# Patient Record
Sex: Male | Born: 1965 | Race: White | Hispanic: No | Marital: Married | Smoking: Former smoker
Health system: Southern US, Community
[De-identification: ages and names within clinical notes are randomized; demographics above are authoritative.]

## PROBLEM LIST (undated history)

## (undated) DIAGNOSIS — K219 Gastro-esophageal reflux disease without esophagitis: Secondary | ICD-10-CM

## (undated) DIAGNOSIS — F419 Anxiety disorder, unspecified: Secondary | ICD-10-CM

## (undated) DIAGNOSIS — I10 Essential (primary) hypertension: Secondary | ICD-10-CM

## (undated) DIAGNOSIS — T7840XA Allergy, unspecified, initial encounter: Secondary | ICD-10-CM

## (undated) DIAGNOSIS — Z860101 Personal history of adenomatous and serrated colon polyps: Secondary | ICD-10-CM

## (undated) DIAGNOSIS — E785 Hyperlipidemia, unspecified: Secondary | ICD-10-CM

## (undated) DIAGNOSIS — Z86010 Personal history of colonic polyps: Secondary | ICD-10-CM

## (undated) HISTORY — DX: Allergy, unspecified, initial encounter: T78.40XA

## (undated) HISTORY — DX: Hyperlipidemia, unspecified: E78.5

## (undated) HISTORY — PX: POLYPECTOMY: SHX149

## (undated) HISTORY — DX: Essential (primary) hypertension: I10

## (undated) HISTORY — PX: GYNECOMASTIA EXCISION: SHX5170

## (undated) HISTORY — PX: COLONOSCOPY: SHX174

## (undated) HISTORY — DX: Personal history of colonic polyps: Z86.010

## (undated) HISTORY — DX: Anxiety disorder, unspecified: F41.9

## (undated) HISTORY — PX: WISDOM TOOTH EXTRACTION: SHX21

## (undated) HISTORY — DX: Personal history of adenomatous and serrated colon polyps: Z86.0101

## (undated) HISTORY — PX: EYE SURGERY: SHX253

## (undated) HISTORY — DX: Gastro-esophageal reflux disease without esophagitis: K21.9

---

## 2005-06-12 ENCOUNTER — Ambulatory Visit: Payer: Self-pay | Admitting: Internal Medicine

## 2005-07-24 ENCOUNTER — Ambulatory Visit: Payer: Self-pay | Admitting: Internal Medicine

## 2005-09-18 ENCOUNTER — Ambulatory Visit: Payer: Self-pay | Admitting: Internal Medicine

## 2006-08-05 ENCOUNTER — Ambulatory Visit: Payer: Self-pay | Admitting: Internal Medicine

## 2007-03-04 ENCOUNTER — Ambulatory Visit: Payer: Self-pay | Admitting: Internal Medicine

## 2007-04-17 ENCOUNTER — Emergency Department (HOSPITAL_COMMUNITY): Admission: EM | Admit: 2007-04-17 | Discharge: 2007-04-17 | Payer: Self-pay | Admitting: Family Medicine

## 2007-04-19 ENCOUNTER — Ambulatory Visit: Payer: Self-pay | Admitting: Internal Medicine

## 2007-04-19 ENCOUNTER — Encounter: Payer: Self-pay | Admitting: Internal Medicine

## 2007-04-19 DIAGNOSIS — I10 Essential (primary) hypertension: Secondary | ICD-10-CM | POA: Insufficient documentation

## 2007-04-22 ENCOUNTER — Ambulatory Visit: Payer: Self-pay | Admitting: Internal Medicine

## 2007-08-04 ENCOUNTER — Ambulatory Visit: Payer: Self-pay | Admitting: Internal Medicine

## 2007-08-04 DIAGNOSIS — Z8739 Personal history of other diseases of the musculoskeletal system and connective tissue: Secondary | ICD-10-CM

## 2007-08-04 DIAGNOSIS — F411 Generalized anxiety disorder: Secondary | ICD-10-CM

## 2007-09-01 ENCOUNTER — Ambulatory Visit: Payer: Self-pay | Admitting: Internal Medicine

## 2008-01-18 ENCOUNTER — Ambulatory Visit: Payer: Self-pay | Admitting: Internal Medicine

## 2008-01-18 DIAGNOSIS — K589 Irritable bowel syndrome without diarrhea: Secondary | ICD-10-CM

## 2008-01-18 DIAGNOSIS — M542 Cervicalgia: Secondary | ICD-10-CM

## 2008-01-18 DIAGNOSIS — N529 Male erectile dysfunction, unspecified: Secondary | ICD-10-CM | POA: Insufficient documentation

## 2009-03-11 ENCOUNTER — Encounter: Payer: Self-pay | Admitting: Internal Medicine

## 2009-03-11 ENCOUNTER — Telehealth: Payer: Self-pay | Admitting: Internal Medicine

## 2009-08-13 ENCOUNTER — Encounter: Payer: Self-pay | Admitting: Internal Medicine

## 2010-01-01 ENCOUNTER — Encounter: Payer: Self-pay | Admitting: Internal Medicine

## 2010-03-12 ENCOUNTER — Ambulatory Visit: Payer: Self-pay | Admitting: Internal Medicine

## 2010-04-08 ENCOUNTER — Ambulatory Visit: Payer: Self-pay | Admitting: Psychology

## 2010-04-18 ENCOUNTER — Ambulatory Visit: Payer: Self-pay | Admitting: Psychology

## 2010-04-25 ENCOUNTER — Ambulatory Visit: Payer: Self-pay | Admitting: Psychology

## 2010-05-02 ENCOUNTER — Ambulatory Visit: Payer: Self-pay | Admitting: Psychology

## 2010-05-05 ENCOUNTER — Telehealth: Payer: Self-pay | Admitting: Family Medicine

## 2010-05-30 ENCOUNTER — Ambulatory Visit: Payer: Self-pay | Admitting: Psychology

## 2010-06-02 ENCOUNTER — Ambulatory Visit: Payer: Self-pay | Admitting: Psychology

## 2010-06-02 ENCOUNTER — Ambulatory Visit: Payer: Self-pay | Admitting: Family Medicine

## 2010-06-09 ENCOUNTER — Telehealth: Payer: Self-pay | Admitting: Family Medicine

## 2010-07-09 ENCOUNTER — Ambulatory Visit: Payer: Self-pay | Admitting: Family Medicine

## 2010-07-16 ENCOUNTER — Ambulatory Visit: Payer: Self-pay | Admitting: Family Medicine

## 2010-07-16 DIAGNOSIS — R5381 Other malaise: Secondary | ICD-10-CM

## 2010-07-16 DIAGNOSIS — R5383 Other fatigue: Secondary | ICD-10-CM

## 2010-07-22 LAB — CONVERTED CEMR LAB
ALT: 27 units/L (ref 0–53)
AST: 22 units/L (ref 0–37)
Alkaline Phosphatase: 72 units/L (ref 39–117)
Basophils Absolute: 0 10*3/uL (ref 0.0–0.1)
Bilirubin, Direct: 0.2 mg/dL (ref 0.0–0.3)
CO2: 27 meq/L (ref 19–32)
Chloride: 102 meq/L (ref 96–112)
Cholesterol: 201 mg/dL — ABNORMAL HIGH (ref 0–200)
Eosinophils Absolute: 0.3 10*3/uL (ref 0.0–0.7)
Lymphocytes Relative: 33.1 % (ref 12.0–46.0)
MCHC: 34.8 g/dL (ref 30.0–36.0)
MCV: 83.9 fL (ref 78.0–100.0)
Monocytes Absolute: 0.6 10*3/uL (ref 0.1–1.0)
Neutrophils Relative %: 53.7 % (ref 43.0–77.0)
PSA: 0.99 ng/mL (ref 0.10–4.00)
Potassium: 4.2 meq/L (ref 3.5–5.1)
RDW: 14.1 % (ref 11.5–14.6)
Sodium: 139 meq/L (ref 135–145)
Total CHOL/HDL Ratio: 7
Total Protein: 7 g/dL (ref 6.0–8.3)

## 2010-12-16 NOTE — Miscellaneous (Signed)
Summary: Flu Vaccination/[facility]  Flu Vaccination/[facility]   Imported By: Maryln Gottron 02/07/2010 12:51:20  _____________________________________________________________________  External Attachment:    Type:   Image     Comment:   External Document

## 2010-12-16 NOTE — Progress Notes (Signed)
Summary: refill request for mirtazepine  Phone Note Refill Request Message from:  Fax from Pharmacy  Refills Requested: Medication #1:  MIRTAZAPINE 15 MG TABS 1 by mouth at bedtime. [facility]  Initial call taken by: Lowella Petties CMA,  June 09, 2010 8:47 AM  Follow-up for Phone Call        Patient given #30 last week. Why is there a refill now, can you check?  This medication is essentially unabusable, but does not make sense. Follow-up by: Hannah Beat MD,  June 09, 2010 8:58 AM  Additional Follow-up for Phone Call Additional follow up Details #1::        Pt got the 30 mirtazapine filled at [facility] [address]  He is changing to a mail order pharmacy.  Lowella Petties CMA  June 09, 2010 11:47 AM     Additional Follow-up for Phone Call Additional follow up Details #2::    90 ok put on my desk Follow-up by: Hannah Beat MD,  June 09, 2010 12:16 PM

## 2010-12-16 NOTE — Assessment & Plan Note (Signed)
Summary: med check/refill/bp check/pt fasting/cjr   Vital Signs:  Patient profile:   45 year old male Weight:      216 pounds Temp:     98.3 degrees F oral BP sitting:   110 / 70  (right arm) Cuff size:   regular  Vitals Entered By: Duard Brady LPN (March 12, 2010 8:00 AM) CC: medication review - refills Is Patient Diabetic? No   CC:  medication review - refills.  History of Present Illness: 45 year old patient who is in today for follow-up.  He is not been seen in some time, but has been followed by psychiatry for anxiety and anger issues.  It was suggested that he follow-up with a counselor.  He has moved to [place] and wishes to reestablish with the [employer].  His blood pressure today well controlled, but does not do much in the way of home monitoring.  Seroquel has been added to his regimen, which he states has been quite helpful with sleep  Preventive Screening-Counseling & Management  Alcohol-Tobacco     Smoking Status: quit  Allergies (verified): No Known Drug Allergies  Past History:  Past Medical History: Hypertension Anxiety  Social History: Smoking Status:  quit  Review of Systems  The patient denies anorexia, fever, weight loss, weight gain, vision loss, decreased hearing, hoarseness, chest pain, syncope, dyspnea on exertion, peripheral edema, prolonged cough, headaches, hemoptysis, abdominal pain, melena, hematochezia, severe indigestion/heartburn, hematuria, incontinence, genital sores, muscle weakness, suspicious skin lesions, transient blindness, difficulty walking, depression, unusual weight change, abnormal bleeding, enlarged lymph nodes, angioedema, breast masses, and testicular masses.    Physical Exam  General:  Well-developed,well-nourished,in no acute distress; alert,appropriate and cooperative throughout examination; the pressure 110/70 Head:  Normocephalic and atraumatic without obvious abnormalities. No apparent alopecia or  balding. Eyes:  No corneal or conjunctival inflammation noted. EOMI. Perrla. Funduscopic exam benign, without hemorrhages, exudates or papilledema. Vision grossly normal. Mouth:  Oral mucosa and oropharynx without lesions or exudates.  Teeth in good repair. Neck:  No deformities, masses, or tenderness noted. Lungs:  Normal respiratory effort, chest expands symmetrically. Lungs are clear to auscultation, no crackles or wheezes. Heart:  Normal rate and regular rhythm. S1 and S2 normal without gallop, murmur, click, rub or other extra sounds. Abdomen:  Bowel sounds positive,abdomen soft and non-tender without masses, organomegaly or hernias noted.   Impression & Recommendations:  Problem # 1:  ANXIETY STATE NOS (ICD-300.00)  The following medications were removed from the medication list:    Sertraline Hcl 50 Mg Tabs (Sertraline hcl) ..... One daily His updated medication list for this problem includes:    Lorazepam 0.5 Mg Tabs (Lorazepam) .Marland Kitchen... 1 two times a day prn    Sertraline Hcl 100 Mg Tabs (Sertraline hcl) ..... One daily  Orders: Psychology Referral (Psychology)  Problem # 2:  HYPERTENSION (ICD-401.9)  His updated medication list for this problem includes:    Benazepril Hcl 40 Mg Tabs (Benazepril hcl) ..... One daily    Hydrochlorothiazide 25 Mg Tabs (Hydrochlorothiazide) .Marland Kitchen... Take one (1) by mouth daily  Complete Medication List: 1)  Benazepril Hcl 40 Mg Tabs (Benazepril hcl) .... One daily 2)  Hydrochlorothiazide 25 Mg Tabs (Hydrochlorothiazide) .... Take one (1) by mouth daily 3)  Naproxen 500 Mg Tabs (Naproxen) .... One twice daily 4)  Viagra 50 Mg Tabs (Sildenafil citrate) 5)  Lorazepam 0.5 Mg Tabs (Lorazepam) .Marland Kitchen.. 1 two times a day prn 6)  Seroquel 25 Mg Tabs (Quetiapine fumarate) .... One at bedtime  7)  Sertraline Hcl 100 Mg Tabs (Sertraline hcl) .... One daily  Patient Instructions: 1)  Please schedule a follow-up appointment in 3 months. 2)  Limit your Sodium  (Salt) to less than 2 grams a day(slightly less than 1/2 a teaspoon) to prevent fluid retention, swelling, or worsening of symptoms. 3)  Check your Blood Pressure regularly. If it is above: 150/90  you should make an appointment. Prescriptions: SERTRALINE HCL 100 MG TABS (SERTRALINE HCL) one daily  #90 x 0   Entered and Authorized by:   Gordy Savers  MD   Signed by:   Gordy Savers  MD on 03/12/2010   Method used:   Print then Give to Patient   RxID:   [id number] SEROQUEL 25 MG TABS (QUETIAPINE FUMARATE) one at bedtime  #90 x 0   Entered and Authorized by:   Gordy Savers  MD   Signed by:   Gordy Savers  MD on 03/12/2010   Method used:   Print then Give to Patient   RxID:   [id number] VIAGRA 50 MG  TABS (SILDENAFIL CITRATE)   #90 x 2   Entered and Authorized by:   Gordy Savers  MD   Signed by:   Gordy Savers  MD on 03/12/2010   Method used:   Print then Give to Patient   RxID:   [id number] HYDROCHLOROTHIAZIDE 25 MG TABS (HYDROCHLOROTHIAZIDE) Take one (1) by mouth daily  #90 x 2   Entered and Authorized by:   Gordy Savers  MD   Signed by:   Gordy Savers  MD on 03/12/2010   Method used:   Print then Give to Patient   RxID:   [id number] BENAZEPRIL HCL 40 MG  TABS (BENAZEPRIL HCL) one daily  #90 x 2   Entered and Authorized by:   Gordy Savers  MD   Signed by:   Gordy Savers  MD on 03/12/2010   Method used:   Print then Give to Patient   RxID:   [id number]

## 2010-12-16 NOTE — Progress Notes (Signed)
Summary: new rx needed  Phone Note Refill Request Message from:  Patient---live call  Refills Requested: Medication #1:  VIAGRA 50 MG  TABS pt is out of town. [id number] in [place] phone--[phone].   Initial call taken by: Warnell Forester,  May 05, 2010 3:51 PM  Follow-up for Phone Call        #6  RF 4 Follow-up by: Gordy Savers  MD,  May 05, 2010 5:02 PM  Additional Follow-up for Phone Call Additional follow up Details #1::        pharmacist at lunch will call back Additional Follow-up by: Kern Reap CMA Duncan Dull),  May 06, 2010 1:35 PM    Additional Follow-up for Phone Call Additional follow up Details #2::    rx called in and patient is aware Follow-up by: Kern Reap CMA Duncan Dull),  May 06, 2010 2:54 PM

## 2010-12-16 NOTE — Assessment & Plan Note (Signed)
Summary: 6 WK F/U DLO   Vital Signs:  Patient profile:   45 year old male Height:      71 inches Weight:      217.6 pounds BMI:     30.46 Temp:     98.9 degrees F oral Pulse rate:   72 / minute Pulse rhythm:   regular BP sitting:   118 / 80  (left arm) Cuff size:   large  Vitals Entered By: Benny Lennert CMA Duncan Dull) (July 09, 2010 8:08 AM)  History of Present Illness: Chief complaint 6 week follow up  Anxiety:  BP: stable    Allergies (verified): No Known Drug Allergies  Past History:  Past medical, surgical, family and social histories (including risk factors) reviewed, and no changes noted (except as noted below).  Past Medical History: Reviewed history from 03/12/2010 and no changes required. Hypertension Anxiety  Past Surgical History: Reviewed history from 04/19/2007 and no changes required. Denies surgical history  Family History: Reviewed history from 04/19/2007 and no changes required. Family History Hypertension Family History of Melanoma Family History of Cardiovascular disorder  Social History: Reviewed history from 06/02/2010 and no changes required. Occupation: Doctor, hospital, [employer], of 15 years Current Smoker Alcohol use-no Drug use-no   Impression & Recommendations:  Problem # 1:  ANXIETY (ICD-300.00) >15 minutes spent in face to face time with patient, >50% spent in counselling or coordination of care: doing pretty well, anxiety well controlled. Having some delayed ejac issues. Things going OK at home. ? decreased sensations in privates with increased medications, but feeling more stable.   His updated medication list for this problem includes:    Lorazepam 0.5 Mg Tabs (Lorazepam) .Marland Kitchen... 1 two times a day as needed    Sertraline Hcl 100 Mg Tabs (Sertraline hcl) .Marland Kitchen... 1 1/2 tabs by mouth daily    Mirtazapine 15 Mg Tabs (Mirtazapine) .Marland Kitchen... 1 by mouth at bedtime  Complete Medication List: 1)  Benazepril Hcl 40 Mg  Tabs (Benazepril hcl) .... One daily 2)  Hydrochlorothiazide 25 Mg Tabs (Hydrochlorothiazide) .... Take one (1) by mouth daily 3)  Naproxen 500 Mg Tabs (Naproxen) .... One twice daily 4)  Viagra 50 Mg Tabs (Sildenafil citrate) 5)  Lorazepam 0.5 Mg Tabs (Lorazepam) .Marland Kitchen.. 1 two times a day as needed 6)  Sertraline Hcl 100 Mg Tabs (Sertraline hcl) .Marland Kitchen.. 1 1/2 tabs by mouth daily 7)  Mirtazapine 15 Mg Tabs (Mirtazapine) .Marland Kitchen.. 1 by mouth at bedtime  Patient Instructions: 1)  Prephysical Labs, several days before, fasting 2)  BMP, HFP, FLP, CBC with diff, TSH, PSA: v77.91, v77.1, ,780.79, v76.44   Current Allergies (reviewed today): No known allergies

## 2010-12-16 NOTE — Assessment & Plan Note (Signed)
Summary: NEW PT TO EST/CLE   Vital Signs:  Patient profile:   45 year old male Height:      71 inches Weight:      215.50 pounds BMI:     30.16 Temp:     98.7 degrees F oral Pulse rate:   72 / minute Pulse rhythm:   regular BP sitting:   100 / 68  (left arm) Cuff size:   large  Vitals Entered By: Sydell Axon LPN (June 02, 2010 9:31 AM) CC: New patient to get established   History of Present Illness: 45 year old male:  HTN: at goal and on medications blood  Anxiety: on Zoloft. Did see a psychiatrist. Right now he is also on some antipsychotics. The suicidal ideation or homicidal ideation. multiple medications in the past. Benzodiazepine use is as very minimal.  Seeing Dr. Dellia Cloud, sertraline.   saw a psychiatrist. Then went to a therapist.   Allergies: No Known Drug Allergies  Past History:  Past medical, surgical, family and social histories (including risk factors) reviewed, and no changes noted (except as noted below).  Past Medical History: Reviewed history from 03/12/2010 and no changes required. Hypertension Anxiety  Past Surgical History: Reviewed history from 04/19/2007 and no changes required. Denies surgical history  Family History: Reviewed history from 04/19/2007 and no changes required. Family History Hypertension Family History of Melanoma Family History of Cardiovascular disorder  Social History: Reviewed history from 04/19/2007 and no changes required. Occupation: [employer] Current Smoker Alcohol use-no Drug use-no  Review of Systems       ROS: GEN: No acute illnesses, no fevers, chills, sweats, fatigue, weight loss, or URI sx. GI: No n/v/d Pulm: No SOB, cough, wheezing Interactive and getting along well at home.  Otherwise, ROS is as per the HPI.   Physical Exam  General:  Well-developed,well-nourished,in no acute distress; alert,appropriate and cooperative throughout examination Head:  Normocephalic and  atraumatic without obvious abnormalities. No apparent alopecia or balding. Ears:  External ear exam shows no significant lesions or deformities.  Otoscopic examination reveals clear canals, tympanic membranes are intact bilaterally without bulging, retraction, inflammation or discharge. Hearing is grossly normal bilaterally. Nose:  no external deformity.   Mouth:  Oral mucosa and oropharynx without lesions or exudates.  Teeth in good repair. Neck:  No deformities, masses, or tenderness noted. Lungs:  Normal respiratory effort, chest expands symmetrically. Lungs are clear to auscultation, no crackles or wheezes. Heart:  Normal rate and regular rhythm. S1 and S2 normal without gallop, murmur, click, rub or other extra sounds. Cervical Nodes:  No lymphadenopathy noted Psych:  Cognition and judgment appear intact. Alert and cooperative with normal attention span and concentration. No apparent delusions, illusions, hallucinations   Impression & Recommendations:  Problem # 1:  HYPERTENSION (ICD-401.9)  His updated medication list for this problem includes:    Benazepril Hcl 40 Mg Tabs (Benazepril hcl) ..... One daily    Hydrochlorothiazide 25 Mg Tabs (Hydrochlorothiazide) .Marland Kitchen... Take one (1) by mouth daily  Problem # 2:  ANXIETY (ICD-300.00) increase medications  The following medications were removed from the medication list:    Sertraline Hcl 100 Mg Tabs (Sertraline hcl) ..... One daily His updated medication list for this problem includes:    Lorazepam 0.5 Mg Tabs (Lorazepam) .Marland Kitchen... 1 two times a day as needed    Sertraline Hcl 100 Mg Tabs (Sertraline hcl) .Marland Kitchen... 1 1/2 tabs by mouth daily    Mirtazapine 15 Mg Tabs (Mirtazapine) .Marland KitchenMarland KitchenMarland KitchenMarland Kitchen  1 by mouth at bedtime  Complete Medication List: 1)  Benazepril Hcl 40 Mg Tabs (Benazepril hcl) .... One daily 2)  Hydrochlorothiazide 25 Mg Tabs (Hydrochlorothiazide) .... Take one (1) by mouth daily 3)  Naproxen 500 Mg Tabs (Naproxen) .... One twice daily 4)   Viagra 50 Mg Tabs (Sildenafil citrate) 5)  Lorazepam 0.5 Mg Tabs (Lorazepam) .Marland Kitchen.. 1 two times a day as needed 6)  Sertraline Hcl 100 Mg Tabs (Sertraline hcl) .Marland Kitchen.. 1 1/2 tabs by mouth daily 7)  Mirtazapine 15 Mg Tabs (Mirtazapine) .Marland Kitchen.. 1 by mouth at bedtime Prescriptions: MIRTAZAPINE 15 MG TABS (MIRTAZAPINE) 1 by mouth at bedtime  #30 x 3   Entered and Authorized by:   Hannah Beat MD   Signed by:   Hannah Beat MD on 06/02/2010   Method used:   Print then Give to Patient   RxID:   [id number] SERTRALINE HCL 100 MG TABS (SERTRALINE HCL) 1 1/2 tabs by mouth daily  #45 x 3   Entered and Authorized by:   Hannah Beat MD   Signed by:   Hannah Beat MD on 06/02/2010   Method used:   Print then Give to Patient   RxID:   [id number]   Current Allergies (reviewed today): No known allergies

## 2011-04-30 ENCOUNTER — Encounter: Payer: Self-pay | Admitting: Family Medicine

## 2011-04-30 ENCOUNTER — Ambulatory Visit (INDEPENDENT_AMBULATORY_CARE_PROVIDER_SITE_OTHER): Payer: BC Managed Care – PPO | Admitting: Family Medicine

## 2011-04-30 VITALS — BP 120/80 | HR 87 | Temp 99.5°F | Wt 217.5 lb

## 2011-04-30 DIAGNOSIS — J069 Acute upper respiratory infection, unspecified: Secondary | ICD-10-CM

## 2011-04-30 MED ORDER — MIRTAZAPINE 15 MG PO TABS: 15.0000 mg | ORAL_TABLET | Freq: Every day | ORAL | Status: DC

## 2011-04-30 MED ORDER — BENAZEPRIL HCL 40 MG PO TABS: 40.0000 mg | ORAL_TABLET | Freq: Every day | ORAL | Status: DC

## 2011-04-30 MED ORDER — HYDROCHLOROTHIAZIDE 25 MG PO TABS: 25.0000 mg | ORAL_TABLET | Freq: Every day | ORAL | Status: DC

## 2011-04-30 MED ORDER — SILDENAFIL CITRATE 50 MG PO TABS: 50.0000 mg | ORAL_TABLET | Freq: Every day | ORAL | Status: DC | PRN

## 2011-04-30 MED ORDER — AMOXICILLIN 500 MG PO CAPS: 500.0000 mg | ORAL_CAPSULE | Freq: Two times a day (BID) | ORAL | Status: AC

## 2011-04-30 MED ORDER — SERTRALINE HCL 100 MG PO TABS: 100.0000 mg | ORAL_TABLET | Freq: Every day | ORAL | Status: DC

## 2011-04-30 MED ORDER — SERTRALINE HCL 100 MG PO TABS: ORAL_TABLET | ORAL | Status: DC

## 2011-04-30 NOTE — Progress Notes (Signed)
Addended by: Jobie Quaker on: 04/30/2011 04:13 PM   Modules accepted: Orders

## 2011-04-30 NOTE — Progress Notes (Signed)
SUBJECTIVE:  Paul Vazquez is a 45 y.o. male who complains of coryza, congestion, sneezing, sore throat, night sweats, productive cough, headache and fever for 7 days. He denies a history of chest pain, nausea and shortness of breath and denies a history of asthma. Patient does smoke cigarettes.   OBJECTIVE: BP 120/80  Pulse 87  Temp(Src) 99.5 F (37.5 C) (Oral)  Wt 217 lb 8 oz (98.657 kg)  He appears well, vital signs are as noted. Ears normal.  Throat and pharynx normal.  Neck supple. No adenopathy in the neck. Nose is congested. Sinuses non tender. The chest is clear, without wheezes or rales.  ASSESSMENT:  viral upper respiratory illness and sinusitis  PLAN: Symptomatic therapy suggested: push fluids, rest and return office visit prn if symptoms persist or worsen. Lack of antibiotic effectiveness discussed with him. Call or return to clinic prn if these symptoms worsen or fail to improve as anticipated. RX for amoxicillin given in case symptoms do not improve as anticipated. The patient indicates understanding of these issues and agrees with the plan.

## 2011-05-06 ENCOUNTER — Other Ambulatory Visit: Payer: Self-pay | Admitting: Internal Medicine

## 2011-05-22 ENCOUNTER — Other Ambulatory Visit: Payer: Self-pay | Admitting: *Deleted

## 2011-05-22 MED ORDER — SERTRALINE HCL 100 MG PO TABS: ORAL_TABLET | ORAL | Status: DC

## 2011-05-22 MED ORDER — HYDROCHLOROTHIAZIDE 25 MG PO TABS: 25.0000 mg | ORAL_TABLET | Freq: Every day | ORAL | Status: DC

## 2011-05-22 MED ORDER — BENAZEPRIL HCL 40 MG PO TABS: 40.0000 mg | ORAL_TABLET | Freq: Every day | ORAL | Status: DC

## 2011-05-22 NOTE — Telephone Encounter (Signed)
Dr. Cyndie Chime patient. I will route to [facility].

## 2011-05-26 ENCOUNTER — Telehealth: Payer: Self-pay | Admitting: *Deleted

## 2011-05-26 NOTE — Telephone Encounter (Signed)
Forms from [facility] are on your desk, please sign these for new scripts.  They are asking for new scripts.  Refills were sent in on 7/6 but since they have faxed forms I dont know if the refills went through.  Also, quantity sent in for sertraline is not correct for a 3 month supply.

## 2011-05-26 NOTE — Telephone Encounter (Signed)
Noted. Will complete paper forms.

## 2011-06-23 ENCOUNTER — Other Ambulatory Visit: Payer: Self-pay | Admitting: Family Medicine

## 2011-06-24 NOTE — Telephone Encounter (Signed)
Dr. Cyndie Chime patient. I will route to him.

## 2011-07-30 ENCOUNTER — Other Ambulatory Visit: Payer: Self-pay | Admitting: *Deleted

## 2011-07-30 ENCOUNTER — Other Ambulatory Visit: Payer: Self-pay | Admitting: Family Medicine

## 2011-07-30 MED ORDER — MIRTAZAPINE 15 MG PO TABS: 15.0000 mg | ORAL_TABLET | Freq: Every day | ORAL | Status: DC

## 2011-08-18 ENCOUNTER — Other Ambulatory Visit: Payer: Self-pay | Admitting: *Deleted

## 2011-08-18 MED ORDER — MIRTAZAPINE 15 MG PO TABS: 15.0000 mg | ORAL_TABLET | Freq: Every day | ORAL | Status: DC

## 2011-08-18 NOTE — Telephone Encounter (Signed)
Last refill 07/30/2011 #30 with 5 refills.  Fax request for 90 day supply.  Please advise.

## 2011-08-18 NOTE — Telephone Encounter (Signed)
Rx sent to [facility]

## 2011-08-18 NOTE — Telephone Encounter (Signed)
Ok to do

## 2012-03-15 ENCOUNTER — Other Ambulatory Visit: Payer: Self-pay | Admitting: Family Medicine

## 2012-03-21 ENCOUNTER — Encounter: Payer: Self-pay | Admitting: Family Medicine

## 2012-03-21 ENCOUNTER — Ambulatory Visit (INDEPENDENT_AMBULATORY_CARE_PROVIDER_SITE_OTHER)
Admission: RE | Admit: 2012-03-21 | Discharge: 2012-03-21 | Disposition: A | Discharge status: Home / Self Care | Payer: BC Managed Care – PPO | Source: Ambulatory Visit | Attending: Family Medicine | Admitting: Family Medicine

## 2012-03-21 ENCOUNTER — Other Ambulatory Visit: Payer: Self-pay | Admitting: *Deleted

## 2012-03-21 ENCOUNTER — Ambulatory Visit (INDEPENDENT_AMBULATORY_CARE_PROVIDER_SITE_OTHER): Payer: BC Managed Care – PPO | Admitting: Family Medicine

## 2012-03-21 VITALS — BP 130/88 | HR 73 | Temp 98.7°F | Ht 71.0 in | Wt 214.1 lb

## 2012-03-21 DIAGNOSIS — M549 Dorsalgia, unspecified: Secondary | ICD-10-CM

## 2012-03-21 MED ORDER — CYCLOBENZAPRINE HCL 10 MG PO TABS: 10.0000 mg | ORAL_TABLET | Freq: Three times a day (TID) | ORAL | Status: AC | PRN

## 2012-03-21 MED ORDER — SILDENAFIL CITRATE 50 MG PO TABS: 50.0000 mg | ORAL_TABLET | Freq: Every day | ORAL | Status: DC | PRN

## 2012-03-21 MED ORDER — PREDNISONE 20 MG PO TABS: 20.0000 mg | ORAL_TABLET | Freq: Every day | ORAL | Status: AC

## 2012-03-21 MED ORDER — TRAMADOL HCL 50 MG PO TABS: 50.0000 mg | ORAL_TABLET | Freq: Four times a day (QID) | ORAL | Status: AC | PRN

## 2012-03-21 NOTE — Telephone Encounter (Signed)
Flexeril, prednisone and viagra refilled today called to [facility].  [facility] in [facility] by Dr. Patsy Lager.  [facility] at [facility].

## 2012-03-21 NOTE — Patient Instructions (Signed)
When prednisone done  Alleve 2 tabs by mouth two times a day over the counter: Take at least for 2 - 3 weeks. This is equal to a prescripton strength dose (GENERIC CHEAPER EQUIVALENT IS NAPROXEN SODIUM)   Call in a few weeks when back gets better

## 2012-03-21 NOTE — Progress Notes (Signed)
Patient Name: Paul Vazquez Date of Birth: November 07, 1966 Medical Record Number: [id number] Gender: male Date of Encounter: 03/21/2012  History of Present Illness:  Paul Vazquez is a 46 y.o. very pleasant male patient who presents with the following:  About a week ago, back really started to feel irritated. Cannot stand up straight.  Ruptured disc in the past. Once recently, but usually a couple of times a year  No numbness or tingling.  20 years lateral leg numbness  Saw Paul Vazquez at [facility] last year, advised working on core musculature.   Back Pain: Patient presents for presents evaluation of low back problems.  Symptoms have been present for several days and include numbness in lateral thigh (not new), pain in lower back diffusely (aching, sharp and stabbing in character; 8/10 in severity) and stiffness in lumbar. Initial inciting event: moving and heard a pop. Symptoms are worst: with moving. Alleviating factors identifiable by patient are bending forwards. Exacerbating factors identifiable by patient are bending backwards, bending forwards and bending sideways. Treatments so far initiated by patient: otc nsaids and tylenol Previous lower back problems: 20+ year. Previous workup: XR, MRI. Previous treatments: none.    Patient Active Problem List  Diagnoses  . Anxiety State, Unspecified  . HYPERTENSION  . IRRITABLE BOWEL SYNDROME  . ERECTILE DYSFUNCTION, ORGANIC  . NECK PAIN  . FATIGUE  . SHOULDER PAIN, RIGHT, HX OF   Past Medical History  Diagnosis Date  . Hypertension   . Anxiety    No past surgical history on file. History  Substance Use Topics  . Smoking status: Current Everyday Smoker  . Smokeless tobacco: Not on file  . Alcohol Use: No   No family history on file. No Known Allergies  Medication list has been reviewed and updated.  Review of Systems:  GEN: No fevers, chills. Nontoxic. Primarily MSK c/o today. MSK: Detailed in the HPI GI: tolerating PO  intake without difficulty Neuro: detailed above Otherwise the pertinent positives of the ROS are noted above.    Physical Examination: Filed Vitals:   03/21/12 1522  BP: 130/88  Pulse: 73  Temp: 98.7 F (37.1 C)  TempSrc: Oral  Height: 5\' 11"  (1.803 m)  Weight: 214 lb 1.9 oz (97.124 kg)  SpO2: 96%    Body mass index is 29.86 kg/(m^2).   GEN: Well-developed,well-nourished,in no acute distress; alert,appropriate and cooperative throughout examination HEENT: Normocephalic and atraumatic without obvious abnormalities. Ears, externally no deformities PULM: Breathing comfortably in no respiratory distress EXT: No clubbing, cyanosis, or edema PSYCH: Normally interactive. Cooperative during the interview. Pleasant. Friendly and conversant. Not anxious or depressed appearing. Normal, full affect.  Range of motion at  the waist: Flexion, extension, lateral bending and rotation: unable to extend, flexion to 30 deg  No echymosis or edema Rises to examination table with mild difficulty Gait: mildly antalgic  Inspection/Deformity: N Paraspinus Tenderness: L4-s1  B Ankle Dorsiflexion (L5,4): 5/5 B Great Toe Dorsiflexion (L5,4): 5/5 Heel Walk (L5): WNL Toe Walk (S1): WNL Rise/Squat (L4): WNL, mild pain  SENSORY B Medial Foot (L4): WNL B Dorsum (L5): WNL B Lateral (S1): WNL Light Touch: lateral thigh decrease sensation Pinprick: WNL  REFLEXES Knee (L4): 2+ Ankle (S1): 2+  B SLR, seated: neg B SLR, supine: neg B Greater Troch: NT B Log Roll: neg B Sciatic Notch: NT   Assessment and Plan:   1. 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 DG Lumbar Spine Complete    Standing Status: Future     Number of Occurrences: 1     Standing Expiration Date: 05/21/2013    Order Specific Question:  Preferred imaging location?    Answer:  [facility]    Order Specific Question:  Reason for exam:    Answer:  back pain, acute    Medications Today: Meds ordered this encounter  Medications  . predniSONE (DELTASONE) 20 MG tablet    Sig: Take 1 tablet (20 mg total) by mouth daily.    Dispense:  7 tablet    Refill:  0  . traMADol (ULTRAM) 50 MG tablet    Sig: Take 1 tablet (50 mg total) by mouth every 6 (six) hours as needed for pain.    Dispense:  50 tablet    Refill:  2  . cyclobenzaprine (FLEXERIL) 10 MG tablet    Sig: Take 1 tablet (10 mg total) by mouth 3 (three) times daily as needed for muscle spasms.    Dispense:  45 tablet    Refill:  2  . sildenafil (VIAGRA) 50 MG tablet    Sig: Take 1 tablet (50 mg total) by mouth daily as needed.    Dispense:  30 tablet    Refill:  3    Dg Lumbar Spine Complete  03/21/2012  *RADIOLOGY REPORT*  Clinical Data: 46 year old male with pain.  LUMBAR SPINE - COMPLETE 4+ VIEW  Comparison: None.  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 Correlation with radiographs is recommended prior to any operative intervention.  L4-L5 moderate to severe disc space loss with endplate spurring. Vertebral height and alignment within normal limits; mild wedging of the lower thoracic levels and L1 appears chronic or congenital. No pars fracture.  SI joints within normal limits.  IMPRESSION: 1.  Transitional anatomy. L5 designated as sacralized for the purposes of this report. 2.  Chronic L4-L5 disc degeneration.  No acute osseous abnormality.  Original Report Authenticated By: Harley Hallmark, M.D.

## 2012-03-22 ENCOUNTER — Other Ambulatory Visit: Payer: Self-pay

## 2012-03-22 NOTE — Telephone Encounter (Signed)
[facility] and [facility] tramadol script.

## 2012-03-22 NOTE — Telephone Encounter (Signed)
Pt said Tramadol was not sent to [facility] when saw Dr Patsy Lager on 03/21/12. Tramadol 50 mg # 50 x 2 called to laurie at [facility]. Pt notified while on phone. Lowella Petties said she would contact [facility] to cancel Tramadol.

## 2012-04-25 ENCOUNTER — Other Ambulatory Visit: Payer: Self-pay | Admitting: Family Medicine

## 2012-04-27 ENCOUNTER — Telehealth: Payer: Self-pay | Admitting: Family Medicine

## 2012-04-27 MED ORDER — LORAZEPAM 0.5 MG PO TABS: 0.5000 mg | ORAL_TABLET | Freq: Three times a day (TID) | ORAL | Status: DC | PRN

## 2012-04-27 NOTE — Telephone Encounter (Signed)
Caller: Braylynn/Patient; PCP: Juleen China.; CB#: [phone];  Call regarding Had To Put Dog Down This Am; Can Something Be Called In?;  Reports grief; trouble coping after loosing dog to bladder cancer.  Dog was 48 yrs old.  Asking for same RX MD sent approx 3 yrs ago when another dog died. Info noted and sent to MD per pt request for RX  for recent loss per Depression Guideline. [facility] [phone].  Please call back to advise if RX called in or must be seen.

## 2012-04-27 NOTE — Telephone Encounter (Signed)
rx called to pharmacy and patient advised 

## 2012-04-27 NOTE — Telephone Encounter (Signed)
i think that is ok, use sparingly  Ativan 0.5 mg, 1 po q 8 hours prn anxiety. #30, 0 refills

## 2012-05-25 ENCOUNTER — Ambulatory Visit (INDEPENDENT_AMBULATORY_CARE_PROVIDER_SITE_OTHER): Payer: BC Managed Care – PPO | Admitting: Family Medicine

## 2012-05-25 ENCOUNTER — Encounter: Payer: Self-pay | Admitting: Family Medicine

## 2012-05-25 VITALS — BP 116/78 | HR 83 | Temp 98.9°F | Ht 71.0 in | Wt 213.1 lb

## 2012-05-25 DIAGNOSIS — R42 Dizziness and giddiness: Secondary | ICD-10-CM

## 2012-05-25 DIAGNOSIS — IMO0001 Reserved for inherently not codable concepts without codable children: Secondary | ICD-10-CM

## 2012-05-25 DIAGNOSIS — R61 Generalized hyperhidrosis: Secondary | ICD-10-CM

## 2012-05-25 NOTE — Progress Notes (Signed)
[facility] [address] [place] [address] Phone: [phone] Fax: [phone]  Date:  05/25/2012   Name:  Paul Vazquez   DOB:  05/02/1966   MRN:  [id number]  PCP:  Hannah Beat, MD    Chief Complaint: No chief complaint on file.   History of Present Illness:  Paul Vazquez is a 46 y.o. very pleasant male patient who presents with the following:  Every once and a while will get jittery, sweat in te afternoon and feels a little lightheaded. The other day, went and got a snickers bar and had had some lunch. Aftere eating ten or fifteen minutes, will feel like vision is maybe floating a little bit.  He denies any palpitations, shortness of breath or chest pain. He has not checked his blood pressure with those times. Is not associated with movement or changing positions. Sometimes he does associated with lack of eating.  It is not associated with intake of any other specific food, supplement, or drink. He has no other GI complaints. No recent illnesses.  This will only occur approximately once a month, and last for very short time.  Past Medical History, Surgical History, Social History, Family History, Problem List, Medications, and Allergies have been reviewed and updated if relevant.  Current Outpatient Prescriptions on File Prior to Visit  Medication Sig Dispense Refill  . benazepril (LOTENSIN) 40 MG tablet TAKE 1 TABLET (40 MG TOTAL) BY MOUTH DAILY.  30 tablet  6  . hydrochlorothiazide 25 MG tablet TAKE 1 TABLET BY MOUTH DAILY  90 tablet  2  . mirtazapine (REMERON) 15 MG tablet Take 1 tablet (15 mg total) by mouth at bedtime.  90 tablet  3  . sertraline (ZOLOFT) 100 MG tablet TAKE 1 1/2 TABLETS BY MOUTH EVERY DAY  45 tablet  5  . sildenafil (VIAGRA) 50 MG tablet Take 1 tablet (50 mg total) by mouth daily as needed.  30 tablet  3  . DISCONTD: mirtazapine (REMERON) 15 MG tablet TAKE 1 TABLET AT BEDTIME  30 tablet  4    Review of  Systems: As above. Neither anxious nor depressed. No syncope.  Physical Examination: Filed Vitals:   05/25/12 1538  BP: 120/84  Pulse: 83  Temp: 98.9 F (37.2 C)   Filed Vitals:   05/25/12 1538  Height: 5\' 11"  (1.803 m)  Weight: 213 lb 1.9 oz (96.671 kg)   Body mass index is 29.72 kg/(m^2). Ideal Body Weight: Weight in (lb) to have BMI = 25: 178.9   Filed Vitals:   05/25/12 1538 05/25/12 1655 05/25/12 1657 05/25/12 1658  BP: 120/84 113/79 109/82 116/78  Pulse: 83 71 85 83  Temp: 98.9 F (37.2 C)     TempSrc: Oral     Height: 5\' 11"  (1.803 m)     Weight: 213 lb 1.9 oz (96.671 kg)     SpO2: 95%        GEN: WDWN, NAD, Non-toxic, A & O x 3 HEENT: Atraumatic, Normocephalic. Neck supple. No masses, No LAD. Ears and Nose: No external deformity. CV: RRR, No M/G/R. No JVD. No thrill. No extra heart sounds. PULM: CTA B, no wheezes, crackles, rhonchi. No retractions. No resp. distress. No accessory muscle use. EXTR: No c/c/e NEURO Normal gait.  PSYCH: Normally interactive. Conversant. Not depressed or anxious appearing.  Calm demeanor.    EKG / Xrays / Labs: None available at the time of encounter.  Assessment and Plan: 1. Dizziness  Basic metabolic panel, CBC with Differential, Hepatic function panel, PSA  2. Sweating  Basic metabolic panel, CBC with Differential, Hepatic function panel, PSA    I think most likely occasional hypoglycemia. No orthostasis.  At this point I reassured the patient, and encourage him to eat at least 3 meals a day, and to keep a snack handy to eat between meals.  Hannah Beat, MD

## 2012-05-26 LAB — CBC WITH DIFFERENTIAL/PLATELET
Basophils Absolute: 0 10*3/uL (ref 0.0–0.1)
Basophils Relative: 0.3 % (ref 0.0–3.0)
Eosinophils Absolute: 0.3 10*3/uL (ref 0.0–0.7)
Lymphocytes Relative: 29.2 % (ref 12.0–46.0)
MCHC: 34 g/dL (ref 30.0–36.0)
Neutrophils Relative %: 60.7 % (ref 43.0–77.0)
Platelets: 245 10*3/uL (ref 150.0–400.0)
RBC: 5.65 Mil/uL (ref 4.22–5.81)

## 2012-05-26 LAB — HEPATIC FUNCTION PANEL
Bilirubin, Direct: 0.1 mg/dL (ref 0.0–0.3)
Total Bilirubin: 0.6 mg/dL (ref 0.3–1.2)

## 2012-05-26 LAB — BASIC METABOLIC PANEL
CO2: 27 mEq/L (ref 19–32)
Calcium: 9.3 mg/dL (ref 8.4–10.5)
Creatinine, Ser: 0.9 mg/dL (ref 0.4–1.5)

## 2012-05-27 ENCOUNTER — Encounter: Payer: Self-pay | Admitting: *Deleted

## 2012-08-09 ENCOUNTER — Other Ambulatory Visit: Payer: Self-pay | Admitting: Family Medicine

## 2012-08-09 NOTE — Telephone Encounter (Signed)
Electronic refill request.  Please advise. 

## 2012-12-09 ENCOUNTER — Other Ambulatory Visit: Payer: Self-pay | Admitting: Family Medicine

## 2013-01-23 ENCOUNTER — Other Ambulatory Visit: Payer: Self-pay

## 2013-01-23 NOTE — Telephone Encounter (Signed)
Pt waiting on mail order on mirtazapine. Pt request 3 day [facility].Please advise.

## 2013-01-24 MED ORDER — MIRTAZAPINE 15 MG PO TABS: 15.0000 mg | ORAL_TABLET | Freq: Every day | ORAL | Status: DC

## 2013-02-03 ENCOUNTER — Other Ambulatory Visit: Payer: Self-pay | Admitting: Family Medicine

## 2013-03-15 ENCOUNTER — Other Ambulatory Visit: Payer: Self-pay | Admitting: Family Medicine

## 2013-06-09 ENCOUNTER — Other Ambulatory Visit: Payer: Self-pay | Admitting: Family Medicine

## 2013-06-09 NOTE — Telephone Encounter (Signed)
Okay to wait for PCPs recommendations.

## 2013-06-09 NOTE — Telephone Encounter (Signed)
Pt has not been seen in a year, asking for a 90 day supply of zoloft. Ok to refill?

## 2013-06-11 NOTE — Telephone Encounter (Signed)
Please schedule a 30 minute CPX for the patient and refill 135, 0 refills  Just tell him he needs to have a physical

## 2013-06-12 NOTE — Telephone Encounter (Signed)
Patient will call back for appt.

## 2013-06-12 NOTE — Telephone Encounter (Signed)
Left message ffor patient to call and schedule appt and medication called to pharmacy

## 2013-06-15 ENCOUNTER — Other Ambulatory Visit: Payer: Self-pay | Admitting: Family Medicine

## 2013-06-16 NOTE — Telephone Encounter (Signed)
Left message asking patient to call back

## 2013-06-16 NOTE — Telephone Encounter (Signed)
i am ok to refill it, but remind him he is way overdue for a physical and has not had one in a few years. Help set up set up later in fall.

## 2013-06-16 NOTE — Telephone Encounter (Signed)
Refill request for viagra.  Patient last seen 7/13, has no upcoming appts.

## 2013-06-20 NOTE — Telephone Encounter (Signed)
Left message on cell phone voice mail advising patient that he needs to call to schedule appt for physical.

## 2013-08-25 ENCOUNTER — Telehealth: Payer: Self-pay | Admitting: *Deleted

## 2013-08-25 MED ORDER — HYDROCHLOROTHIAZIDE 25 MG PO TABS: ORAL_TABLET | ORAL | Status: DC

## 2013-08-25 MED ORDER — BENAZEPRIL HCL 40 MG PO TABS: ORAL_TABLET | ORAL | Status: DC

## 2013-08-25 MED ORDER — MIRTAZAPINE 15 MG PO TABS: ORAL_TABLET | ORAL | Status: DC

## 2013-08-25 NOTE — Telephone Encounter (Signed)
Patient called today and scheduled his CPX for 10/09/2013.  Needs refills on BP meds and Remeron to get him to that appointment.

## 2013-10-09 ENCOUNTER — Encounter: Payer: BC Managed Care – PPO | Admitting: Family Medicine

## 2013-11-05 ENCOUNTER — Other Ambulatory Visit: Payer: Self-pay | Admitting: Family Medicine

## 2013-11-14 ENCOUNTER — Other Ambulatory Visit: Payer: Self-pay | Admitting: Family Medicine

## 2013-11-14 DIAGNOSIS — Z125 Encounter for screening for malignant neoplasm of prostate: Secondary | ICD-10-CM

## 2013-11-14 DIAGNOSIS — Z13220 Encounter for screening for lipoid disorders: Secondary | ICD-10-CM

## 2013-11-14 DIAGNOSIS — Z79899 Other long term (current) drug therapy: Secondary | ICD-10-CM

## 2013-11-15 ENCOUNTER — Other Ambulatory Visit (INDEPENDENT_AMBULATORY_CARE_PROVIDER_SITE_OTHER): Payer: BC Managed Care – PPO

## 2013-11-15 DIAGNOSIS — Z79899 Other long term (current) drug therapy: Secondary | ICD-10-CM

## 2013-11-15 DIAGNOSIS — Z125 Encounter for screening for malignant neoplasm of prostate: Secondary | ICD-10-CM

## 2013-11-15 DIAGNOSIS — Z13220 Encounter for screening for lipoid disorders: Secondary | ICD-10-CM

## 2013-11-15 LAB — CBC WITH DIFFERENTIAL/PLATELET
Eosinophils Absolute: 0.4 10*3/uL (ref 0.0–0.7)
Eosinophils Relative: 4.6 % (ref 0.0–5.0)
HCT: 47.7 % (ref 39.0–52.0)
Hemoglobin: 16.2 g/dL (ref 13.0–17.0)
Lymphs Abs: 2.8 10*3/uL (ref 0.7–4.0)
MCHC: 34 g/dL (ref 30.0–36.0)
MCV: 83.5 fl (ref 78.0–100.0)
Monocytes Absolute: 0.6 10*3/uL (ref 0.1–1.0)
Neutro Abs: 5.2 10*3/uL (ref 1.4–7.7)
Neutrophils Relative %: 57.7 % (ref 43.0–77.0)
Platelets: 256 10*3/uL (ref 150.0–400.0)
RDW: 13.9 % (ref 11.5–14.6)
WBC: 9.1 10*3/uL (ref 4.5–10.5)

## 2013-11-15 LAB — BASIC METABOLIC PANEL
Calcium: 9.5 mg/dL (ref 8.4–10.5)
Creatinine, Ser: 0.8 mg/dL (ref 0.4–1.5)
GFR: 117.39 mL/min (ref >60.00)
Glucose, Bld: 103 mg/dL — ABNORMAL HIGH (ref 70–99)
Potassium: 3.8 mEq/L (ref 3.5–5.1)
Sodium: 137 mEq/L (ref 135–145)

## 2013-11-15 LAB — HEPATIC FUNCTION PANEL
ALT: 32 U/L (ref 0–53)
AST: 23 U/L (ref 0–37)
Alkaline Phosphatase: 70 U/L (ref 39–117)
Bilirubin, Direct: 0.1 mg/dL (ref 0.0–0.3)
Total Bilirubin: 0.6 mg/dL (ref 0.3–1.2)

## 2013-11-15 LAB — LIPID PANEL
HDL: 27.2 mg/dL — ABNORMAL LOW (ref >39.00)
Total CHOL/HDL Ratio: 8
VLDL: 141.2 mg/dL — ABNORMAL HIGH (ref 0.0–40.0)

## 2013-11-15 LAB — PSA: PSA: 0.7 ng/mL (ref 0.10–4.00)

## 2013-11-17 LAB — LDL CHOLESTEROL, DIRECT: Direct LDL: 81.5 mg/dL

## 2013-11-22 ENCOUNTER — Encounter: Payer: Self-pay | Admitting: Family Medicine

## 2013-11-22 ENCOUNTER — Ambulatory Visit (INDEPENDENT_AMBULATORY_CARE_PROVIDER_SITE_OTHER): Payer: BC Managed Care – PPO | Admitting: Family Medicine

## 2013-11-22 VITALS — BP 116/72 | HR 83 | Temp 98.9°F | Ht 70.5 in | Wt 228.5 lb

## 2013-11-22 DIAGNOSIS — Z0000 Encounter for general adult medical examination without abnormal findings: Secondary | ICD-10-CM

## 2013-11-22 DIAGNOSIS — E669 Obesity, unspecified: Secondary | ICD-10-CM | POA: Insufficient documentation

## 2013-11-22 MED ORDER — SILDENAFIL CITRATE 100 MG PO TABS: 100.0000 mg | ORAL_TABLET | ORAL | Status: DC | PRN

## 2013-11-22 MED ORDER — SERTRALINE HCL 100 MG PO TABS: 150.0000 mg | ORAL_TABLET | Freq: Every day | ORAL | Status: DC

## 2013-11-22 MED ORDER — HYDROCHLOROTHIAZIDE 25 MG PO TABS: ORAL_TABLET | ORAL | Status: DC

## 2013-11-22 MED ORDER — BENAZEPRIL HCL 40 MG PO TABS: ORAL_TABLET | ORAL | Status: DC

## 2013-11-22 MED ORDER — MIRTAZAPINE 15 MG PO TABS: ORAL_TABLET | ORAL | Status: DC

## 2013-11-22 NOTE — Progress Notes (Signed)
Pre-visit discussion using our clinic review tool. No additional management support is needed unless otherwise documented below in the visit note.  

## 2013-11-22 NOTE — Progress Notes (Signed)
Date:  11/22/2013   Name:  Paul Vazquez   DOB:  19-Sep-1966   MRN:  [id number] Gender: male Age: 48 y.o.  Primary Physician:  Owens Loffler, MD   Chief Complaint: Annual Exam   Subjective:   History of Present Illness:  Paul Vazquez is a 48 y.o. pleasant patient who presents with the following:  Preventative Health Maintenance Visit:  Health Maintenance Summary Reviewed and updated, unless pt declines services.  Tobacco History Reviewed. Just quit, patches. Alcohol: No concerns, no excessive use Exercise Habits: Some activity, rec at least 30 mins 5 times a week STD concerns: no risk or activity to increase risk Drug Use: None Encouraged self-testicular check  Health Maintenance  Topic Date Due  . Tetanus/tdap  05/09/1983  . Influenza Vaccine  06/16/2013    Immunization History  Administered Date(s) Administered  . Influenza Split 08/19/2012    Patient Active Problem List   Diagnosis Date Noted  . Obesity (BMI 30-39.9) 11/22/2013  . FATIGUE 07/16/2010  . IRRITABLE BOWEL SYNDROME 01/18/2008  . 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 NECK PAIN 01/18/2008  . Anxiety State, Unspecified 08/04/2007  . SHOULDER PAIN, RIGHT, HX OF 08/04/2007  . HYPERTENSION 04/19/2007    Past Medical History  Diagnosis Date  . Hypertension   . Anxiety     No past surgical history on file.  History   Social History  . Marital Status: Single    Spouse Name: N/A    Number of Children: N/A  . Years of Education: N/A   Occupational History  . auto damage appraiser    Social History Main Topics  . Smoking status: Former Smoker    Quit date: 11/16/2013  . Smokeless tobacco: Never Used  . Alcohol Use: No  . Drug Use: No  . Sexual Activity: Not on file   Other Topics Concern  . Not on file   Social History Narrative   Partner, [name], of 15 years    No family history on file.  No Known Allergies  Medication list has been reviewed and updated.  Review of  Systems:  General: Denies fever, chills, sweats. No significant weight loss. Eyes: Denies blurring,significant itching ENT: Denies earache, sore throat, and hoarseness. Cardiovascular: Denies chest pains, palpitations, dyspnea on exertion Respiratory: Denies cough, dyspnea at rest,wheeezing Breast: no concerns about lumps GI: Denies nausea, vomiting, diarrhea, constipation, change in bowel habits, abdominal pain, melena, hematochezia GU: Denies penile discharge, ED, urinary flow / outflow problems. No STD concerns. Musculoskeletal: Denies back pain, joint pain Derm: Denies rash, itching Neuro: Denies  paresthesias, frequent falls, frequent headaches Psych: Denies depression, anxiety Endocrine: Denies cold intolerance, heat intolerance, polydipsia Heme: Denies enlarged lymph nodes Allergy: No hayfever  Objective:   Physical Examination: BP 116/72  Pulse 83  Temp(Src) 98.9 F (37.2 C) (Oral)  Ht 5' 10.5" (1.791 m)  Wt 228 lb 8 oz (103.647 kg)  BMI 32.31 kg/m2 Ideal Body Weight: Weight in (lb) to have BMI = 25: 176.4  GEN: well developed, well nourished, no acute distress Eyes: conjunctiva and lids normal, PERRLA, EOMI ENT: TM clear, nares clear, oral exam WNL Neck: supple, no lymphadenopathy, no thyromegaly, no JVD Pulm: clear to auscultation and percussion, respiratory effort normal CV: regular rate and rhythm, S1-S2, no murmur, rub or gallop, no bruits, peripheral pulses normal and symmetric, no cyanosis, clubbing, edema or varicosities GI: soft, non-tender; no hepatosplenomegaly, masses; active bowel sounds all quadrants GU: no hernia, testicular mass, penile discharge Lymph: no  cervical, axillary or inguinal adenopathy MSK: gait normal, muscle tone and strength WNL, no joint swelling, effusions, discoloration, crepitus  SKIN: clear, good turgor, color WNL, no rashes, lesions, or ulcerations Neuro: normal mental status, normal strength, sensation, and motion Psych: alert;  oriented to person, place and time, normally interactive and not anxious or depressed in appearance.  All labs reviewed with patient.  Lipids:    Component Value Date/Time   CHOL 211* 11/15/2013 0936   TRIG 706.0* 11/15/2013 0936   HDL 27.20* 11/15/2013 0936   LDLDIRECT 81.5 11/15/2013 0936   VLDL 141.2* 11/15/2013 0936   CHOLHDL 8 11/15/2013 0936    CBC:    Component Value Date/Time   WBC 9.1 11/15/2013 0936   HGB 16.2 11/15/2013 0936   HCT 47.7 11/15/2013 0936   PLT 256.0 11/15/2013 0936   MCV 83.5 11/15/2013 0936   NEUTROABS 5.2 11/15/2013 0936   LYMPHSABS 2.8 11/15/2013 0936   MONOABS 0.6 11/15/2013 0936   EOSABS 0.4 11/15/2013 0936   BASOSABS 0.1 11/15/2013 3016    Basic Metabolic Panel:    Component Value Date/Time   NA 137 11/15/2013 0936   K 3.8 11/15/2013 0936   CL 103 11/15/2013 0936   CO2 27 11/15/2013 0936   BUN 13 11/15/2013 0936   CREATININE 0.8 11/15/2013 0936   GLUCOSE 103* 11/15/2013 0936   CALCIUM 9.5 11/15/2013 0936    Lab Results  Component Value Date   ALT 32 11/15/2013   AST 23 11/15/2013   ALKPHOS 70 11/15/2013   BILITOT 0.6 11/15/2013    Lab Results  Component Value Date   TSH 0.79 07/16/2010    Lab Results  Component Value Date   PSA 0.70 11/15/2013   PSA 0.71 05/25/2012   PSA 0.99 07/16/2010    Assessment & Plan:   Health Maintenance Exam: The patient's preventative maintenance and recommended screening tests for an annual wellness exam were reviewed in full today. Brought up to date unless services declined.  Counselled on the importance of diet, exercise, and its role in overall health and mortality. The patient's FH and SH was reviewed, including their home life, tobacco status, and drug and alcohol status.   Routine general medical examination at a health care facility  Triglycerides are quite high. We talked about potentially doing no medication, and he thinks that he can lose 25 pounds or so fairly easily. He is  going to come in in 6 months and we'll recheck his cholesterol level.  New medications, updates to list, dose adjustments: Meds ordered this encounter  Medications  . sildenafil (VIAGRA) 100 MG tablet    Sig: Take 1 tablet (100 mg total) by mouth as needed for erectile dysfunction.    Dispense:  18 tablet    Refill:  3  . sertraline (ZOLOFT) 100 MG tablet    Sig: Take 1.5 tablets (150 mg total) by mouth daily.    Dispense:  135 tablet    Refill:  3  . mirtazapine (REMERON) 15 MG tablet    Sig: TAKE 1 TABLET BY MOUTH AT BEDTIME    Dispense:  90 tablet    Refill:  3  . hydrochlorothiazide (HYDRODIURIL) 25 MG tablet    Sig: TAKE 1 TABLET BY MOUTH DAILY    Dispense:  90 tablet    Refill:  3  . benazepril (LOTENSIN) 40 MG tablet    Sig: TAKE 1 TABLET DAILY    Dispense:  90 tablet    Refill:  3  Signed,  Maud Deed. Mily Malecki, MD, [place] at [facility] [place] [address] Phone: [phone] Fax: [phone]    Medication List       This list is accurate as of: 11/22/13 11:59 PM.  Always use your most recent med list.               benazepril 40 MG tablet  Commonly known as:  LOTENSIN  TAKE 1 TABLET DAILY     hydrochlorothiazide 25 MG tablet  Commonly known as:  HYDRODIURIL  TAKE 1 TABLET BY MOUTH DAILY     mirtazapine 15 MG tablet  Commonly known as:  REMERON  TAKE 1 TABLET BY MOUTH AT BEDTIME     sertraline 100 MG tablet  Commonly known as:  ZOLOFT  Take 1.5 tablets (150 mg total) by mouth daily.     sildenafil 100 MG tablet  Commonly known as:  VIAGRA  Take 1 tablet (100 mg total) by mouth as needed for erectile dysfunction.

## 2013-12-27 ENCOUNTER — Other Ambulatory Visit: Payer: Self-pay | Admitting: Family Medicine

## 2014-01-04 ENCOUNTER — Ambulatory Visit (INDEPENDENT_AMBULATORY_CARE_PROVIDER_SITE_OTHER): Payer: BC Managed Care – PPO | Admitting: Family Medicine

## 2014-01-04 ENCOUNTER — Encounter: Payer: Self-pay | Admitting: Family Medicine

## 2014-01-04 VITALS — BP 130/82 | HR 85 | Temp 98.7°F | Ht 70.5 in | Wt 232.5 lb

## 2014-01-04 DIAGNOSIS — F411 Generalized anxiety disorder: Secondary | ICD-10-CM

## 2014-01-04 DIAGNOSIS — R635 Abnormal weight gain: Secondary | ICD-10-CM

## 2014-01-04 DIAGNOSIS — T887XXA Unspecified adverse effect of drug or medicament, initial encounter: Secondary | ICD-10-CM

## 2014-01-04 DIAGNOSIS — N529 Male erectile dysfunction, unspecified: Secondary | ICD-10-CM

## 2014-01-04 DIAGNOSIS — I10 Essential (primary) hypertension: Secondary | ICD-10-CM

## 2014-01-04 MED ORDER — BUPROPION HCL ER (XL) 150 MG PO TB24: 150.0000 mg | ORAL_TABLET | Freq: Every day | ORAL | Status: DC

## 2014-01-04 MED ORDER — AMLODIPINE BESYLATE 10 MG PO TABS: 10.0000 mg | ORAL_TABLET | Freq: Every day | ORAL | Status: DC

## 2014-01-04 MED ORDER — SERTRALINE HCL 100 MG PO TABS: 100.0000 mg | ORAL_TABLET | Freq: Every day | ORAL | Status: DC

## 2014-01-04 NOTE — Progress Notes (Signed)
Pre-visit discussion using our clinic review tool. No additional management support is needed unless otherwise documented below in the visit note.  

## 2014-01-04 NOTE — Progress Notes (Signed)
Date:  01/04/2014   Name:  Paul Vazquez   DOB:  [birth year]   MRN:  [id number] Gender: male Age: 48 y.o.  Primary Physician:  Owens Loffler, MD   Chief Complaint: Discuss Medication   Subjective:   History of Present Illness:  Paul Vazquez is a 48 y.o. very pleasant male patient who presents with the following:  Aggravated, some annoyed. A little bit more agitated.   Libido, erections.   Depression meds: he and his partner think that his medication might not be working fully, he is a little bit more irritable than before. He also has been having some difficulty with some libido As well as erections intermittently. He also has been having some weight gain as well.  He has been on locked for some time, and now is on 150 mg daily. He also is taking Remeron at nighttime, which is helped quite a bit for sleep.  He also is having some dry mouth, which he associates with taking his blood pressure medication, and he is on an as approval, as well as hydrochlorothiazide.  Past Medical History, Surgical History, Social History, Family History, Problem List, Medications, and Allergies have been reviewed and updated if relevant.  Review of Systems:  GEN: No acute illnesses, no fevers, chills. GI: No n/v/d, eating normally Pulm: No SOB Interactive and getting along well at home.  Otherwise, ROS is as per the HPI.  Objective:   Physical Examination: BP 130/82  Pulse 85  Temp(Src) 98.7 F (37.1 C) (Oral)  Ht 5' 10.5" (1.791 m)  Wt 232 lb 8 oz (105.461 kg)  BMI 32.88 kg/m2   GEN: WDWN, NAD, Non-toxic, A & O x 3 HEENT: Atraumatic, Normocephalic. Neck supple. No masses, No LAD. Ears and Nose: No external deformity. CV: RRR, No M/G/R. No JVD. No thrill. No extra heart sounds. PULM: CTA B, no wheezes, crackles, rhonchi. No retractions. No resp. distress. No accessory muscle use. EXTR: No c/c/e NEURO Normal gait.  PSYCH: Normally interactive. Conversant. Not depressed or  anxious appearing.  Calm demeanor.   Laboratory and Imaging Data:  Assessment & Plan:    Generalized anxiety disorder  HYPERTENSION  ERECTILE DYSFUNCTION, ORGANIC  Weight gain  Medication side effect  Stop medications below and start new medications.  Decrease zoloft dose and add wellbutrin. If this does not help or worsened probs, them I will change to SNRI  Stop remeron and try OTC meds for sleep  F/u 6 weeks  Patient Instructions  STOP Mirtazipine STOP Hydrochlorothiazide  START AMLODIPINE START WELLBUTRIN  Insomnia:  Melatonin 3-5 mg can be taken 1 hour before sleep very safely every day  Antihistamines: 2 tabs of Benadryl is OK Can also take 2 Dramamine (get the older version that can cause drowsiness) Or Unisom (doxylamine)    No orders of the defined types were placed in this encounter.    Signed,  Maud Deed. Raevon Broom, MD, [place] at [facility] [place] [address] Phone: [phone] Fax: [phone]  Patient's Medications  New Prescriptions   AMLODIPINE (NORVASC) 10 MG TABLET    Take 1 tablet (10 mg total) by mouth daily.   BUPROPION (WELLBUTRIN XL) 150 MG 24 HR TABLET    Take 1 tablet (150 mg total) by mouth daily.  Previous Medications   BENAZEPRIL (LOTENSIN) 40 MG TABLET    TAKE 1 TABLET DAILY   SILDENAFIL (VIAGRA) 100 MG TABLET    Take 1  tablet (100 mg total) by mouth as needed for erectile dysfunction.  Modified Medications   Modified Medication Previous Medication   SERTRALINE (ZOLOFT) 100 MG TABLET sertraline (ZOLOFT) 100 MG tablet      Take 1 tablet (100 mg total) by mouth daily.    Take 1.5 tablets (150 mg total) by mouth daily.  Discontinued Medications   HYDROCHLOROTHIAZIDE (HYDRODIURIL) 25 MG TABLET    TAKE 1 TABLET BY MOUTH DAILY   MIRTAZAPINE (REMERON) 15 MG TABLET    TAKE 1 TABLET BY MOUTH AT BEDTIME

## 2014-01-04 NOTE — Patient Instructions (Addendum)
STOP Mirtazipine STOP Hydrochlorothiazide  START AMLODIPINE START WELLBUTRIN  Insomnia:  Melatonin 3-5 mg can be taken 1 hour before sleep very safely every day  Antihistamines: 2 tabs of Benadryl is OK Can also take 2 Dramamine (get the older version that can cause drowsiness) Or Unisom (doxylamine)

## 2014-01-25 ENCOUNTER — Telehealth: Payer: Self-pay | Admitting: Family Medicine

## 2014-01-25 NOTE — Telephone Encounter (Signed)
Please gather more information. HEWich medicaiton does he think is causing issue?

## 2014-01-25 NOTE — Telephone Encounter (Signed)
Spoke with Quillian Quince.  He states he is not sure which medication is causing the problems.  He was started on two new medications at last office visit, Amlodipine and Wellbutrin XL.  He states the itching is mostly on his hands and feet.  No redness but itching is uncontrollable.  Please advise.

## 2014-01-25 NOTE — Telephone Encounter (Signed)
Left message for patient to hold both the amlodipine and Wellbutrin XL.  Can take benadryl at night and Claritin during the day for the itching.  Call the office tomorrow to schedule an appointment to see Dr. Lorelei Pont on Monday.

## 2014-01-25 NOTE — Telephone Encounter (Signed)
Patient Information:  Caller Name: Paul Vazquez  Phone: [phone]  Patient: Paul Vazquez, Paul Vazquez  Gender: Male  DOB: 12/10/1965  Age: 48 Years  PCP: Owens Loffler [facility])  Office Follow Up:  Does the office need to follow up with this patient?: Yes  Instructions For The Office: Please reivew and advise pt  RN Note:  Pt agrees to Dispo for PCP to advise and call back.  Symptoms  Reason For Call & Symptoms: Pt is calling about change of medicaiton 1 month ago.  Pt reports that he is itching.  He reports that sxs have been present x 1 month since changing meds.  He is itching on hands, feet, head, eyes.  No rash.  No redness.  Pt reports that no changes in soaps, lotions, environment.  Reviewed Health History In EMR: Yes  Reviewed Medications In EMR: Yes  Reviewed Allergies In EMR: Yes  Reviewed Surgeries / Procedures: Yes  Date of Onset of Symptoms: 12/28/2013  Guideline(s) Used:  Itching - Widespread  Disposition Per Guideline:   Discuss with PCP and Callback by Nurse Today  Reason For Disposition Reached:   Taking prescription medication that could cause itching (e.g., codeine/morphine/other opiates, aspirin)  Advice Given:  Call Back If:  You become worse.  Patient Will Follow Care Advice:  YES

## 2014-01-25 NOTE — Telephone Encounter (Signed)
Have him hold both, take benadryl for itching at night and claritin during the day. Have him follow up with his PCP ASAP.

## 2014-01-26 NOTE — Telephone Encounter (Signed)
Agree 

## 2014-01-29 ENCOUNTER — Ambulatory Visit (INDEPENDENT_AMBULATORY_CARE_PROVIDER_SITE_OTHER): Payer: BC Managed Care – PPO | Admitting: Family Medicine

## 2014-01-29 ENCOUNTER — Encounter: Payer: Self-pay | Admitting: Family Medicine

## 2014-01-29 VITALS — BP 120/86 | HR 76 | Temp 98.3°F | Ht 70.5 in | Wt 232.5 lb

## 2014-01-29 DIAGNOSIS — I10 Essential (primary) hypertension: Secondary | ICD-10-CM

## 2014-01-29 DIAGNOSIS — L299 Pruritus, unspecified: Secondary | ICD-10-CM

## 2014-01-29 DIAGNOSIS — F411 Generalized anxiety disorder: Secondary | ICD-10-CM

## 2014-01-29 NOTE — Progress Notes (Signed)
   Date:  01/29/2014   Name:  Paul Vazquez   DOB:  [birth year]   MRN:  [id number]  Primary Physician:  Owens Loffler, MD   Chief Complaint: Reaction to Medication   Subjective:   History of Present Illness:  Paul Vazquez is a 48 y.o. very pleasant male patient who presents with the following:  Itching, hands - 95% better.   F/u norvasc and wellbutrin  01/04/2014 Last OV with Owens Loffler, MD, we started him on Wellbutrin as an adjunctive measure for worsening anxiety. We also added some amlodipine for hypertension.  The following day he started to develop some itching he was relatively intense and bothersome, but since that time his symptoms have continued to improve, he is much less anxious, and his hypertension is stabilized. Right now he thinks he is about 95 percent improved with minimal itching.  Past Medical History, Surgical History, Social History, Family History, Problem List, Medications, and Allergies have been reviewed and updated if relevant.  Review of Systems:  GEN: No acute illnesses, no fevers, chills. GI: No n/v/d, eating normally Pulm: No SOB Interactive and getting along well at home.  Otherwise, ROS is as per the HPI.  Objective:   Physical Examination: BP 120/86  Pulse 76  Temp(Src) 98.3 F (36.8 C) (Oral)  Ht 5' 10.5" (1.791 m)  Wt 232 lb 8 oz (105.461 kg)  BMI 32.88 kg/m2   GEN: WDWN, NAD, Non-toxic, A & O x 3 HEENT: Atraumatic, Normocephalic. Neck supple. No masses, No LAD. Ears and Nose: No external deformity. CV: RRR, No M/G/R. No JVD. No thrill. No extra heart sounds. PULM: CTA B, no wheezes, crackles, rhonchi. No retractions. No resp. distress. No accessory muscle use. EXTR: No c/c/e NEURO Normal gait.  PSYCH: Normally interactive. Conversant. Not depressed or anxious appearing.  Calm demeanor.   Laboratory and Imaging Data:  Assessment & Plan:   Itching  Generalized anxiety disorder  HYPERTENSION  This seems to be  most likely secondary to one of this to medications or a combination. Nevertheless, he has improved. It's also theoretically possible that it is due to lowering his Zoloft dose.  We both think that continuing his current medications given that he is improved with his anxiety seems most prudent thing. If the has worsening symptoms or side effects, we may have to alter his current regimen.  No orders of the defined types were placed in this encounter.   Patient's Medications  New Prescriptions   No medications on file  Previous Medications   AMLODIPINE (NORVASC) 10 MG TABLET    Take 1 tablet (10 mg total) by mouth daily.   BENAZEPRIL (LOTENSIN) 40 MG TABLET    TAKE 1 TABLET DAILY   BUPROPION (WELLBUTRIN XL) 150 MG 24 HR TABLET    Take 1 tablet (150 mg total) by mouth daily.   SERTRALINE (ZOLOFT) 100 MG TABLET    Take 1 tablet (100 mg total) by mouth daily.   SILDENAFIL (VIAGRA) 100 MG TABLET    Take 1 tablet (100 mg total) by mouth as needed for erectile dysfunction.  Modified Medications   No medications on file  Discontinued Medications   No medications on file   There are no Patient Instructions on file for this visit.  Signed,  Maud Deed. Jakayden Cancio, MD, [place] at [facility] [place] [address] Phone: [phone] Fax: [phone]

## 2014-01-29 NOTE — Progress Notes (Signed)
Pre visit review using our clinic review tool, if applicable. No additional management support is needed unless otherwise documented below in the visit note. 

## 2014-02-15 ENCOUNTER — Ambulatory Visit: Payer: BC Managed Care – PPO | Admitting: Family Medicine

## 2014-03-15 ENCOUNTER — Ambulatory Visit: Payer: BC Managed Care – PPO | Admitting: Psychology

## 2014-03-29 ENCOUNTER — Ambulatory Visit: Payer: BC Managed Care – PPO | Admitting: Family Medicine

## 2014-04-01 ENCOUNTER — Other Ambulatory Visit: Payer: Self-pay | Admitting: Family Medicine

## 2014-04-04 ENCOUNTER — Ambulatory Visit: Payer: BC Managed Care – PPO | Admitting: Psychology

## 2014-09-30 ENCOUNTER — Other Ambulatory Visit: Payer: Self-pay | Admitting: Family Medicine

## 2014-09-30 NOTE — Telephone Encounter (Signed)
Last office visit 01/29/2014.  Ok to refill?

## 2014-11-06 ENCOUNTER — Other Ambulatory Visit: Payer: Self-pay | Admitting: *Deleted

## 2014-11-06 MED ORDER — AMLODIPINE BESYLATE 10 MG PO TABS: ORAL_TABLET | ORAL | Status: DC

## 2014-12-10 ENCOUNTER — Encounter: Payer: Self-pay | Admitting: Gastroenterology

## 2014-12-17 ENCOUNTER — Ambulatory Visit (INDEPENDENT_AMBULATORY_CARE_PROVIDER_SITE_OTHER): Payer: BLUE CROSS/BLUE SHIELD | Admitting: Psychology

## 2014-12-17 DIAGNOSIS — F332 Major depressive disorder, recurrent severe without psychotic features: Secondary | ICD-10-CM

## 2014-12-18 ENCOUNTER — Telehealth: Payer: Self-pay | Admitting: *Deleted

## 2014-12-18 NOTE — Telephone Encounter (Signed)
Dr. Cheryln Manly called to speak with Dr. Lorelei Pont about Paul Vazquez.   He states Paul Vazquez needs a change in her psychotropic medications.  Dr. Cheryln Manly recommends either Paxil or Prozac.  He states Paul Vazquez should be calling our office also to see if he needs an appointment or if Dr. Lorelei Pont wants to address this over the phone.  Will forward message to Dr. Lorelei Pont for review.

## 2014-12-19 NOTE — Telephone Encounter (Signed)
Can you have him follow-up with me - the titration can be fairly confusing, so we need to make sure he very clearly understands it

## 2014-12-19 NOTE — Telephone Encounter (Signed)
Left message for Paul Vazquez to return my call.

## 2014-12-19 NOTE — Telephone Encounter (Signed)
Appointment scheduled 12/20/2014 at 11:00 am with Dr. Lorelei Pont.

## 2014-12-20 ENCOUNTER — Encounter: Payer: Self-pay | Admitting: Family Medicine

## 2014-12-20 ENCOUNTER — Ambulatory Visit (INDEPENDENT_AMBULATORY_CARE_PROVIDER_SITE_OTHER): Payer: BLUE CROSS/BLUE SHIELD | Admitting: Family Medicine

## 2014-12-20 VITALS — BP 100/64 | HR 94 | Temp 98.8°F | Ht 70.5 in | Wt 219.5 lb

## 2014-12-20 DIAGNOSIS — F3341 Major depressive disorder, recurrent, in partial remission: Secondary | ICD-10-CM | POA: Diagnosis not present

## 2014-12-20 DIAGNOSIS — G4700 Insomnia, unspecified: Secondary | ICD-10-CM

## 2014-12-20 DIAGNOSIS — F411 Generalized anxiety disorder: Secondary | ICD-10-CM | POA: Diagnosis not present

## 2014-12-20 MED ORDER — FLUOXETINE HCL 20 MG PO TABS: 20.0000 mg | ORAL_TABLET | Freq: Every day | ORAL | Status: DC

## 2014-12-20 MED ORDER — TRAZODONE HCL 50 MG PO TABS: 25.0000 mg | ORAL_TABLET | Freq: Every evening | ORAL | Status: DC | PRN

## 2014-12-20 NOTE — Progress Notes (Signed)
Dr. Frederico Hamman T. Jayce Boyko, MD, [facility] and Sports Medicine [place], [address] Phone: [phone] Fax: [phone]  12/20/2014  Patient: Paul Vazquez, MRN: [id number], DOB: September 23, 1966, 49 y.o.  Primary Physician:  Owens Loffler, MD  Chief Complaint: Medication Dose Change  Subjective:   Paul Vazquez is a 49 y.o. very pleasant male patient who presents with the following:  I received a call last week from the patient psychologist, Dr. Cheryln Manly, asking about a medication change.  He has had some breakthrough anxiety and depression.  He has been on Zoloft now for a long time.  He also is having some side effects with delayed ejaculation and some worsening of erectile dysfunction.  He is here today to discuss titrating off his former medication and going onto a new one.  He also is been having quite a bit of difficulty sleeping.  He has been taking over-the-counter Unisom, but this is been only helping him somewhat.  In the past we had him on Remeron, but we stopped this.  Has been taking Unisom. Trazodone - trial  Past Medical History, Surgical History, Social History, Family History, Problem List, Medications, and Allergies have been reviewed and updated if relevant.   GEN: No acute illnesses, no fevers, chills. GI: No n/v/d, eating normally Pulm: No SOB Interactive and getting along well at home.  Otherwise, ROS is as per the HPI.  Objective:   BP 100/64 mmHg  Pulse 94  Temp(Src) 98.8 F (37.1 C) (Oral)  Ht 5' 10.5" (1.791 m)  Wt 219 lb 8 oz (99.565 kg)  BMI 31.04 kg/m2  GEN: WDWN, NAD, Non-toxic, A & O x 3 HEENT: Atraumatic, Normocephalic. Neck supple. No masses, No LAD. Ears and Nose: No external deformity. CV: RRR, No M/G/R. No JVD. No thrill. No extra heart sounds. PULM: CTA B, no wheezes, crackles, rhonchi. No retractions. No resp. distress. No accessory muscle use. EXTR: No c/c/e NEURO Normal gait.  PSYCH:  Normally interactive. Conversant. Not depressed or anxious appearing.  Calm demeanor.   Laboratory and Imaging Data:  Assessment and Plan:   Generalized anxiety disorder  Major depressive disorder, recurrent episode, in partial remission  Insomnia  Titrate off Zoloft.  Then begin Prozac.  Trazodone at nighttime for sleep.  Refer to the patient instructions sections for details of plan shared with patient.   Follow-up: Return in about 7 weeks (around 02/07/2015).  New Prescriptions   FLUOXETINE (PROZAC) 20 MG TABLET    Take 1 tablet (20 mg total) by mouth daily.   TRAZODONE (DESYREL) 50 MG TABLET    Take 0.5-1 tablets (25-50 mg total) by mouth at bedtime as needed for sleep.   Patient Instructions  Next dose, decrease to Zoloft 100 mg, 1 pill. Do this for 1 week.  After 1 week, decrease to 1/2 tablet of Zoloft. (equal to 50 mg) for 1 week.  Then cut down to 1/4 tablet (25 mg) for 1 week.   At the end of this week, then start Prozac.      Signed,  Maud Deed. Allexis Bordenave, MD   Patient's Medications  New Prescriptions   FLUOXETINE (PROZAC) 20 MG TABLET    Take 1 tablet (20 mg total) by mouth daily.   TRAZODONE (DESYREL) 50 MG TABLET    Take 0.5-1 tablets (25-50 mg total) by mouth at bedtime as needed for sleep.  Previous Medications   AMLODIPINE (NORVASC) 10 MG TABLET    TAKE 1  TABLET (10 MG TOTAL) BY MOUTH DAILY.   BENAZEPRIL (LOTENSIN) 40 MG TABLET    TAKE 1 TABLET DAILY   SERTRALINE (ZOLOFT) 100 MG TABLET    Take 1 tablet (100 mg total) by mouth daily.   SILDENAFIL (VIAGRA) 100 MG TABLET    Take 1 tablet (100 mg total) by mouth as needed for erectile dysfunction.  Modified Medications   No medications on file  Discontinued Medications   BUPROPION (WELLBUTRIN XL) 150 MG 24 HR TABLET    Take 1 tablet (150 mg total) by mouth daily.

## 2014-12-20 NOTE — Progress Notes (Signed)
Refer to physician note. Opened in error by [employer]. I am unable to close this.  Electronically Signed  By: Owens Loffler, MD On: 12/23/2014 9:39 AM

## 2014-12-20 NOTE — Patient Instructions (Signed)
Next dose, decrease to Zoloft 100 mg, 1 pill. Do this for 1 week.  After 1 week, decrease to 1/2 tablet of Zoloft. (equal to 50 mg) for 1 week.  Then cut down to 1/4 tablet (25 mg) for 1 week.   At the end of this week, then start Prozac.

## 2014-12-21 ENCOUNTER — Telehealth: Payer: Self-pay | Admitting: Family Medicine

## 2014-12-21 NOTE — Telephone Encounter (Signed)
emmi emailed °

## 2014-12-23 DIAGNOSIS — F324 Major depressive disorder, single episode, in partial remission: Secondary | ICD-10-CM | POA: Insufficient documentation

## 2015-01-14 ENCOUNTER — Ambulatory Visit (AMBULATORY_SURGERY_CENTER): Payer: Self-pay | Admitting: *Deleted

## 2015-01-14 VITALS — Ht 70.0 in | Wt 219.6 lb

## 2015-01-14 DIAGNOSIS — Z1210 Encounter for screening for malignant neoplasm of intestinal tract, unspecified: Secondary | ICD-10-CM

## 2015-01-14 MED ORDER — MOVIPREP 100 G PO SOLR: 1.0000 | Freq: Once | ORAL | Status: DC

## 2015-01-14 NOTE — Progress Notes (Signed)
No egg or soy allergy No diet pills No home 02 use No issues with past sedation Pt declined emmi

## 2015-01-21 ENCOUNTER — Encounter: Payer: Self-pay | Admitting: Gastroenterology

## 2015-01-28 ENCOUNTER — Ambulatory Visit (AMBULATORY_SURGERY_CENTER): Payer: BLUE CROSS/BLUE SHIELD | Admitting: Gastroenterology

## 2015-01-28 ENCOUNTER — Encounter: Payer: Self-pay | Admitting: Gastroenterology

## 2015-01-28 VITALS — BP 136/110 | HR 65 | Temp 98.0°F | Resp 18 | Ht 70.0 in | Wt 219.0 lb

## 2015-01-28 DIAGNOSIS — Z1211 Encounter for screening for malignant neoplasm of colon: Secondary | ICD-10-CM

## 2015-01-28 DIAGNOSIS — D120 Benign neoplasm of cecum: Secondary | ICD-10-CM | POA: Diagnosis not present

## 2015-01-28 MED ORDER — SODIUM CHLORIDE 0.9 % IV SOLN: 500.0000 mL | INTRAVENOUS | Status: DC

## 2015-01-28 NOTE — Progress Notes (Signed)
Called to room to assist during endoscopic procedure.  Patient ID and intended procedure confirmed with present staff. Received instructions for my participation in the procedure from the performing physician.  

## 2015-01-28 NOTE — Patient Instructions (Addendum)
One of your biggest health concerns is your smoking.  This increases your risk for most cancers and serious cardiovascular diseases such as strokes, heart attacks.  You should try your best to stop.  If you need assistance, please contact your PCP or Smoking Cessation Class at [facility] [phone]) or [facility] (1-800-QUIT-NOW).   YOU HAD AN ENDOSCOPIC PROCEDURE TODAY AT [facility]:   Refer to the procedure report that was given to you for any specific questions about what was found during the examination.  If the procedure report does not answer your questions, please call your gastroenterologist to clarify.  If you requested that your care partner not be given the details of your procedure findings, then the procedure report has been included in a sealed envelope for you to review at your convenience later.  YOU SHOULD EXPECT: Some feelings of bloating in the abdomen. Passage of more gas than usual.  Walking can help get rid of the air that was put into your GI tract during the procedure and reduce the bloating. If you had a lower endoscopy (such as a colonoscopy or flexible sigmoidoscopy) you may notice spotting of blood in your stool or on the toilet paper. If you underwent a bowel prep for your procedure, you may not have a normal bowel movement for a few days.  Please Note:  You might notice some irritation and congestion in your nose or some drainage.  This is from the oxygen used during your procedure.  There is no need for concern and it should clear up in a day or so.  SYMPTOMS TO REPORT IMMEDIATELY:   Following lower endoscopy (colonoscopy or flexible sigmoidoscopy):  Excessive amounts of blood in the stool  Significant tenderness or worsening of abdominal pains  Swelling of the abdomen that is new, acute  Fever of 100F or higher   For urgent or emergent issues, a gastroenterologist can be reached at any hour by calling [phone].   DIET:  Your first meal following the procedure should be a small meal and then it is ok to progress to your normal diet. Heavy or fried foods are harder to digest and may make you feel nauseous or bloated.  Likewise, meals heavy in dairy and vegetables can increase bloating.  Drink plenty of fluids but you should avoid alcoholic beverages for 24 hours.  ACTIVITY:  You should plan to take it easy for the rest of today and you should NOT DRIVE or use heavy machinery until tomorrow (because of the sedation medicines used during the test).    FOLLOW UP: Our staff will call the number listed on your records the next business day following your procedure to check on you and address any questions or concerns that you may have regarding the information given to you following your procedure. If we do not reach you, we will leave a message.  However, if you are feeling well and you are not experiencing any problems, there is no need to return our call.  We will assume that you have returned to your regular daily activities without incident.  If any biopsies were taken you will be contacted by phone or by letter within the next 1-3 weeks.  Please call us at [phone] if you have not heard about the biopsies in 3 weeks.    SIGNATURES/CONFIDENTIALITY: You and/or your care partner have signed paperwork which will be entered into your electronic medical record.  These signatures attest to the fact  that that the information above on your After Visit Summary has been reviewed and is understood.  Full responsibility of the confidentiality of this discharge information lies with you and/or your care-partner.  Recommendations Discharge instructions given to patient and/or care partner. Polyp handout provided. Next colonoscopy determined by pathology results; 5 or 10 years.

## 2015-01-28 NOTE — Progress Notes (Signed)
Report to PACU, RN, vss, BBS= Clear.  

## 2015-01-28 NOTE — Op Note (Signed)
[facility]  [address] [place], [id number]   COLONOSCOPY PROCEDURE REPORT  PATIENT: Paul Vazquez, Paul Vazquez  MR#: [id number] BIRTHDATE: 09-12-1966 , 48  yrs. old GENDER: male ENDOSCOPIST: Milus Banister, MD REFERRED LH:[id number] Celedonio Savage, M.D. PROCEDURE DATE:  01/28/2015 PROCEDURE:   Colonoscopy, screening and Colonoscopy with snare polypectomy First Screening Colonoscopy - Avg.  risk and is 50 yrs.  old or older Yes.  Prior Negative Screening - Now for repeat screening. N/A  History of Adenoma - Now for follow-up colonoscopy & has been > or = to 3 yrs.  N/A ASA CLASS:   Class II INDICATIONS:Screening for colonic neoplasia and Colorectal Neoplasm Risk Assessment for this procedure is average risk. MEDICATIONS: Monitored anesthesia care, Propofol 300 mg IV, and lidocaine 200mg  IV  DESCRIPTION OF PROCEDURE:   After the risks benefits and alternatives of the procedure were thoroughly explained, informed consent was obtained.  The digital rectal exam revealed no abnormalities of the rectum.   The [id number]  endoscope was introduced through the anus and advanced to the cecum, which was identified by both the appendix and ileocecal valve. No adverse events experienced.   The quality of the prep was excellent.  The instrument was then slowly withdrawn as the colon was fully examined.  COLON FINDINGS: A sessile polyp measuring 3 mm in size was found at the cecum.  A polypectomy was performed with a cold snare.  The resection was complete, the polyp tissue was completely retrieved and sent to histology.   The examination was otherwise normal. Retroflexed views revealed no abnormalities. The time to cecum = 3.5 Withdrawal time = 8.8   The scope was withdrawn and the procedure completed. COMPLICATIONS: There were no immediate complications.  ENDOSCOPIC IMPRESSION: 1.   Sessile polyp was found at the cecum; polypectomy was performed with a cold snare 2.   The  examination was otherwise normal  RECOMMENDATIONS: If the polyp(s) removed today are proven to be adenomatous (pre-cancerous) polyps, you will need a repeat colonoscopy in 5 years.  Otherwise you should continue to follow colorectal cancer screening guidelines for "routine risk" patients with colonoscopy in 10 years.  You will receive a letter within 1-2 weeks with the results of your biopsy as well as final recommendations.  Please call my office if you have not received a letter after 3 weeks.  eSigned:  Milus Banister, MD 01/28/2015 10:52 AM

## 2015-01-29 ENCOUNTER — Telehealth: Payer: Self-pay | Admitting: *Deleted

## 2015-01-29 NOTE — Telephone Encounter (Signed)
  Follow up Call-  Call back number 01/28/2015  Post procedure Call Back phone  # [phone]  Permission to leave phone message Yes     Patient questions:  Message left to call us if necessary.

## 2015-02-04 ENCOUNTER — Encounter: Payer: Self-pay | Admitting: Gastroenterology

## 2015-02-06 ENCOUNTER — Ambulatory Visit (INDEPENDENT_AMBULATORY_CARE_PROVIDER_SITE_OTHER): Payer: BLUE CROSS/BLUE SHIELD | Admitting: Family Medicine

## 2015-02-06 ENCOUNTER — Encounter: Payer: Self-pay | Admitting: Family Medicine

## 2015-02-06 ENCOUNTER — Other Ambulatory Visit: Payer: Self-pay | Admitting: Family Medicine

## 2015-02-06 VITALS — BP 114/74 | HR 89 | Temp 98.8°F | Ht 70.5 in | Wt 221.5 lb

## 2015-02-06 DIAGNOSIS — F411 Generalized anxiety disorder: Secondary | ICD-10-CM

## 2015-02-06 DIAGNOSIS — D1739 Benign lipomatous neoplasm of skin and subcutaneous tissue of other sites: Secondary | ICD-10-CM

## 2015-02-06 DIAGNOSIS — G4700 Insomnia, unspecified: Secondary | ICD-10-CM

## 2015-02-06 DIAGNOSIS — D171 Benign lipomatous neoplasm of skin and subcutaneous tissue of trunk: Secondary | ICD-10-CM

## 2015-02-06 DIAGNOSIS — F3341 Major depressive disorder, recurrent, in partial remission: Secondary | ICD-10-CM

## 2015-02-06 MED ORDER — FLUOXETINE HCL 40 MG PO CAPS: 40.0000 mg | ORAL_CAPSULE | Freq: Every day | ORAL | Status: DC

## 2015-02-06 NOTE — Patient Instructions (Signed)

## 2015-02-06 NOTE — Progress Notes (Signed)
Pre visit review using our clinic review tool, if applicable. No additional management support is needed unless otherwise documented below in the visit note. 

## 2015-02-06 NOTE — Progress Notes (Signed)
Dr. Frederico Hamman T. Wylee Dorantes, MD, [facility] Primary [facility] and Sports Medicine [place], [address] Phone: [phone] Fax: [phone]  12/20/2014  Patient: Paul Vazquez, MRN: [id number], DOB: [birth year], 49 y.o.  Primary Physician:  Owens Loffler, MD  Chief Complaint: Medication Dose Change  Subjective:   Paul Vazquez is a 49 y.o. very pleasant male patient who presents with the following:  Trazadone - 50 mg, stopped  Still having some breakthrough anxiety. In general, though, he thinks that he is doing quite a bit better compared to when he was on Zoloft.  He is not having any ED-type symptoms that he was having on his prior medication.  He is still having some breakthrough anxiety and panic attacks.  Currently he is on 20 mg of Prozac.  Lipoma, relatively newlipoma that is bothersome for the patient.  12/21/2014 Last OV with Owens Loffler, MD  I received a call last week from the patient psychologist, Dr. Cheryln Manly, asking about a medication change.  He has had some breakthrough anxiety and depression.  He has been on Zoloft now for a long time.  He also is having some side effects with delayed ejaculation and some worsening of erectile dysfunction.  He is here today to discuss titrating off his former medication and going onto a new one.  He also is been having quite a bit of difficulty sleeping.  He has been taking over-the-counter Unisom, but this is been only helping him somewhat.  In the past we had him on Remeron, but we stopped this.  Has been taking Unisom. Trazodone - trial  Past Medical History, Surgical History, Social History, Family History, Problem List, Medications, and Allergies have been reviewed and updated if relevant.   GEN: No acute illnesses, no fevers, chills. GI: No n/v/d, eating normally Pulm: No SOB Interactive and getting along well at home.  Otherwise, ROS is as per the HPI.  Objective:   BP 100/64 mmHg  Pulse 94   Temp(Src) 98.8 F (37.1 C) (Oral)  Ht 5' 10.5" (1.791 m)  Wt 219 lb 8 oz (99.565 kg)  BMI 31.04 kg/m2  GEN: WDWN, NAD, Non-toxic, A & O x 3 HEENT: Atraumatic, Normocephalic. Neck supple. No masses, No LAD. Ears and Nose: No external deformity. CV: RRR, No M/G/R. No JVD. No thrill. No extra heart sounds. PULM: CTA B, no wheezes, crackles, rhonchi. No retractions. No resp. distress. No accessory muscle use. EXTR: No c/c/e NEURO Normal gait.  PSYCH: Normally interactive. Conversant. Not depressed or anxious appearing.  Calm demeanor.  Skin: lipoma on buttocks cheek, well-demarcated  Laboratory and Imaging Data:  Assessment and Plan:   Major depressive disorder, recurrent episode, in partial remission  Generalized anxiety disorder  Insomnia  Lipoma of buttock - Plan: Ambulatory referral to General Surgery  incr prozac  Lipoma - patient would like removal, so I am going to consult general surgery  Follow-up: No Follow-up on file.  New Prescriptions   FLUOXETINE (PROZAC) 40 MG CAPSULE    Take 1 capsule (40 mg total) by mouth daily.   Orders Placed This Encounter  Procedures  . Ambulatory referral to General Surgery    Signed,  Frederico Hamman T. Jaquasia Doscher, MD   Patient's Medications  New Prescriptions   FLUOXETINE (PROZAC) 40 MG CAPSULE    Take 1 capsule (40 mg total) by mouth daily.  Previous Medications   ASPIRIN 81 MG TABLET    Take 81 mg by mouth daily.  DOXYLAMINE, SLEEP, (UNISOM) 25 MG TABLET    Take 25 mg by mouth at bedtime as needed.   MULTIPLE VITAMIN (MULTIVITAMIN) TABLET    Take 1 tablet by mouth daily.   SILDENAFIL (VIAGRA) 100 MG TABLET    Take 1 tablet (100 mg total) by mouth as needed for erectile dysfunction.  Modified Medications   Modified Medication Previous Medication   AMLODIPINE (NORVASC) 10 MG TABLET amLODipine (NORVASC) 10 MG tablet      Take 1 tablet (10 mg total) by mouth daily.    TAKE 1 TABLET (10 MG TOTAL) BY MOUTH DAILY.   BENAZEPRIL  (LOTENSIN) 40 MG TABLET benazepril (LOTENSIN) 40 MG tablet      TAKE 1 TABLET DAILY    TAKE 1 TABLET DAILY  Discontinued Medications   FLUOXETINE (PROZAC) 20 MG TABLET    Take 1 tablet (20 mg total) by mouth daily.

## 2015-12-23 ENCOUNTER — Ambulatory Visit (INDEPENDENT_AMBULATORY_CARE_PROVIDER_SITE_OTHER): Payer: BLUE CROSS/BLUE SHIELD | Admitting: Family Medicine

## 2015-12-23 ENCOUNTER — Encounter: Payer: Self-pay | Admitting: Family Medicine

## 2015-12-23 VITALS — BP 110/70 | HR 96 | Temp 98.6°F | Ht 70.5 in | Wt 221.0 lb

## 2015-12-23 DIAGNOSIS — J0110 Acute frontal sinusitis, unspecified: Secondary | ICD-10-CM

## 2015-12-23 MED ORDER — AMOXICILLIN-POT CLAVULANATE 875-125 MG PO TABS: 1.0000 | ORAL_TABLET | Freq: Two times a day (BID) | ORAL | Status: DC
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
## 2015-12-23 NOTE — Progress Notes (Signed)
Pre visit review using our clinic review tool, if applicable. No additional management support is needed unless otherwise documented below in the visit note. 

## 2015-12-31 ENCOUNTER — Encounter: Payer: Self-pay | Admitting: Family Medicine

## 2016-01-01 MED ORDER — FLUTICASONE PROPIONATE 50 MCG/ACT NA SUSP: 2.0000 | Freq: Every day | NASAL | Status: DC

## 2016-01-01 MED ORDER — LEVOFLOXACIN 500 MG PO TABS: 500.0000 mg | ORAL_TABLET | Freq: Every day | ORAL | Status: DC

## 2016-01-01 NOTE — Addendum Note (Signed)
Addended by: Carter Kitten on: 01/01/2016 04:59 PM   Modules accepted: Orders

## 2016-01-01 NOTE — Telephone Encounter (Signed)
Ok.  D/c augmentin  Levaquin 500 mg, 1 po daily, #10  Add flonase, 2 sprays each nostril daily, #1, refill prn

## 2016-02-09 ENCOUNTER — Other Ambulatory Visit: Payer: Self-pay | Admitting: Family Medicine

## 2016-02-09 NOTE — Telephone Encounter (Signed)
Last office visit 12/23/15 for Sinusitis.  Last CPE 11/22/13.  No future appointments.  Refill?

## 2016-02-11 ENCOUNTER — Encounter: Payer: Self-pay | Admitting: Family Medicine

## 2016-02-11 MED ORDER — FLUOXETINE HCL 40 MG PO CAPS: 40.0000 mg | ORAL_CAPSULE | Freq: Every day | ORAL | Status: DC

## 2016-03-15 ENCOUNTER — Encounter: Payer: Self-pay | Admitting: Family Medicine

## 2016-03-15 MED ORDER — SILDENAFIL CITRATE 100 MG PO TABS: 100.0000 mg | ORAL_TABLET | ORAL | Status: DC | PRN

## 2016-03-16 ENCOUNTER — Telehealth: Payer: Self-pay | Admitting: *Deleted

## 2016-03-16 NOTE — Telephone Encounter (Signed)
Received fax from [facility] requesting PA for Viagara 100 mg tablets.  Form completed and faxed back to [phone].  Awaiting Decision.

## 2016-03-17 NOTE — Telephone Encounter (Signed)
PA for Viagra was approved effective 02/16/2016 through 03/17/2017.

## 2016-04-07 ENCOUNTER — Encounter: Payer: Self-pay | Admitting: Family Medicine

## 2016-04-08 ENCOUNTER — Ambulatory Visit (INDEPENDENT_AMBULATORY_CARE_PROVIDER_SITE_OTHER): Payer: BLUE CROSS/BLUE SHIELD | Admitting: Family Medicine

## 2016-04-08 ENCOUNTER — Encounter: Payer: Self-pay | Admitting: Family Medicine

## 2016-04-08 VITALS — BP 92/60 | HR 84 | Temp 99.4°F | Ht 70.5 in | Wt 218.8 lb

## 2016-04-08 DIAGNOSIS — A938 Other specified arthropod-borne viral fevers: Secondary | ICD-10-CM | POA: Diagnosis not present

## 2016-04-08 DIAGNOSIS — M2550 Pain in unspecified joint: Secondary | ICD-10-CM

## 2016-04-08 MED ORDER — DOXYCYCLINE HYCLATE 100 MG PO CAPS: 100.0000 mg | ORAL_CAPSULE | Freq: Two times a day (BID) | ORAL | Status: DC

## 2016-04-08 NOTE — Progress Notes (Signed)
Pre visit review using our clinic review tool, if applicable. No additional management support is needed unless otherwise documented below in the visit note. 
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
## 2016-04-09 LAB — ROCKY MTN SPOTTED FVR ABS PNL(IGG+IGM)
RMSF IGM: NOT DETECTED
RMSF IgG: NOT DETECTED

## 2016-04-10 LAB — LYME ABY, WSTRN BLT IGG & IGM W/BANDS
B burgdorferi IgG Abs (IB): NEGATIVE
B burgdorferi IgM Abs (IB): NEGATIVE
LYME DISEASE 18 KD IGG: NONREACTIVE
LYME DISEASE 23 KD IGG: NONREACTIVE
LYME DISEASE 28 KD IGG: NONREACTIVE
LYME DISEASE 30 KD IGG: NONREACTIVE
LYME DISEASE 39 KD IGG: NONREACTIVE
LYME DISEASE 66 KD IGG: NONREACTIVE
LYME DISEASE 93 KD IGG: NONREACTIVE
Lyme Disease 23 kD IgM: NONREACTIVE
Lyme Disease 39 kD IgM: NONREACTIVE
Lyme Disease 41 kD IgG: NONREACTIVE
Lyme Disease 41 kD IgM: NONREACTIVE
Lyme Disease 45 kD IgG: NONREACTIVE
Lyme Disease 58 kD IgG: NONREACTIVE

## 2016-04-13 LAB — EHRLICHIA ANTIBODY PANEL: E chaffeensis (HGE) Ab, IgG: <1:64 {titer}

## 2016-04-22 ENCOUNTER — Ambulatory Visit (INDEPENDENT_AMBULATORY_CARE_PROVIDER_SITE_OTHER): Payer: BLUE CROSS/BLUE SHIELD | Admitting: Internal Medicine

## 2016-04-22 ENCOUNTER — Encounter: Payer: Self-pay | Admitting: Internal Medicine

## 2016-04-22 VITALS — BP 98/70 | HR 73 | Temp 98.8°F | Wt 223.0 lb

## 2016-04-22 DIAGNOSIS — R590 Localized enlarged lymph nodes: Secondary | ICD-10-CM

## 2016-04-22 NOTE — Progress Notes (Signed)
Pre visit review using our clinic review tool, if applicable. No additional management support is needed unless otherwise documented below in the visit note. 

## 2016-04-22 NOTE — Assessment & Plan Note (Signed)
Too low for salivary gland Not really enlarged but that is where he is tender No fever or any constitutional symptoms No cat exposure--but still may be related to recent tick borne illness  Looks okay Will observe only reeval if not better in a couple of weeks
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
## 2016-09-06 ENCOUNTER — Other Ambulatory Visit: Payer: Self-pay | Admitting: Family Medicine

## 2016-12-03 ENCOUNTER — Other Ambulatory Visit: Payer: Self-pay

## 2016-12-04 ENCOUNTER — Other Ambulatory Visit (INDEPENDENT_AMBULATORY_CARE_PROVIDER_SITE_OTHER): Payer: Managed Care, Other (non HMO)

## 2016-12-04 DIAGNOSIS — Z1159 Encounter for screening for other viral diseases: Secondary | ICD-10-CM

## 2016-12-04 DIAGNOSIS — Z114 Encounter for screening for human immunodeficiency virus [HIV]: Secondary | ICD-10-CM

## 2016-12-04 DIAGNOSIS — R7989 Other specified abnormal findings of blood chemistry: Secondary | ICD-10-CM

## 2016-12-04 DIAGNOSIS — Z79899 Other long term (current) drug therapy: Secondary | ICD-10-CM

## 2016-12-04 DIAGNOSIS — Z13220 Encounter for screening for lipoid disorders: Secondary | ICD-10-CM

## 2016-12-04 DIAGNOSIS — Z125 Encounter for screening for malignant neoplasm of prostate: Secondary | ICD-10-CM

## 2016-12-04 LAB — BASIC METABOLIC PANEL
BUN: 12 mg/dL (ref 6–23)
CO2: 27 meq/L (ref 19–32)
Calcium: 9.8 mg/dL (ref 8.4–10.5)
Chloride: 102 mEq/L (ref 96–112)
Creatinine, Ser: 0.75 mg/dL (ref 0.40–1.50)
GFR: 115.98 mL/min (ref >60.00)
GLUCOSE: 98 mg/dL (ref 70–99)
POTASSIUM: 4.6 meq/L (ref 3.5–5.1)
SODIUM: 138 meq/L (ref 135–145)

## 2016-12-04 LAB — CBC WITH DIFFERENTIAL/PLATELET
BASOS ABS: 0 10*3/uL (ref 0.0–0.1)
Basophils Relative: 0.5 % (ref 0.0–3.0)
EOS PCT: 3.1 % (ref 0.0–5.0)
Eosinophils Absolute: 0.2 10*3/uL (ref 0.0–0.7)
HCT: 46.7 % (ref 39.0–52.0)
HEMOGLOBIN: 16.1 g/dL (ref 13.0–17.0)
LYMPHS ABS: 2.7 10*3/uL (ref 0.7–4.0)
Lymphocytes Relative: 33.6 % (ref 12.0–46.0)
MCHC: 34.5 g/dL (ref 30.0–36.0)
MCV: 81.8 fl (ref 78.0–100.0)
Monocytes Absolute: 0.5 10*3/uL (ref 0.1–1.0)
Monocytes Relative: 5.9 % (ref 3.0–12.0)
NEUTROS PCT: 56.9 % (ref 43.0–77.0)
Neutro Abs: 4.6 10*3/uL (ref 1.4–7.7)
Platelets: 250 10*3/uL (ref 150.0–400.0)
RBC: 5.71 Mil/uL (ref 4.22–5.81)
RDW: 14 % (ref 11.5–15.5)
WBC: 8 10*3/uL (ref 4.0–10.5)

## 2016-12-04 LAB — LIPID PANEL
CHOL/HDL RATIO: 7
Cholesterol: 203 mg/dL — ABNORMAL HIGH (ref 0–200)
HDL: 29.5 mg/dL — AB (ref >39.00)
Triglycerides: 605 mg/dL — ABNORMAL HIGH (ref 0.0–149.0)

## 2016-12-04 LAB — PSA: PSA: 1.63 ng/mL (ref 0.10–4.00)

## 2016-12-04 LAB — HEPATIC FUNCTION PANEL
ALK PHOS: 89 U/L (ref 39–117)
ALT: 38 U/L (ref 0–53)
AST: 26 U/L (ref 0–37)
Albumin: 4.5 g/dL (ref 3.5–5.2)
Bilirubin, Direct: 0.1 mg/dL (ref 0.0–0.3)
Total Bilirubin: 0.5 mg/dL (ref 0.2–1.2)
Total Protein: 7.3 g/dL (ref 6.0–8.3)

## 2016-12-04 LAB — LDL CHOLESTEROL, DIRECT: Direct LDL: 82 mg/dL

## 2016-12-05 LAB — HEPATITIS C ANTIBODY: HCV AB: NEGATIVE

## 2016-12-05 LAB — HIV ANTIBODY (ROUTINE TESTING W REFLEX): HIV 1&2 Ab, 4th Generation: NONREACTIVE

## 2016-12-07 ENCOUNTER — Encounter: Payer: Self-pay | Admitting: Family Medicine

## 2016-12-07 ENCOUNTER — Ambulatory Visit (INDEPENDENT_AMBULATORY_CARE_PROVIDER_SITE_OTHER): Payer: Managed Care, Other (non HMO) | Admitting: Family Medicine

## 2016-12-07 VITALS — BP 104/64 | HR 81 | Temp 98.7°F | Ht 71.0 in | Wt 224.5 lb

## 2016-12-07 DIAGNOSIS — Z0000 Encounter for general adult medical examination without abnormal findings: Secondary | ICD-10-CM | POA: Diagnosis not present

## 2016-12-07 DIAGNOSIS — Z23 Encounter for immunization: Secondary | ICD-10-CM | POA: Diagnosis not present

## 2016-12-07 DIAGNOSIS — E781 Pure hyperglyceridemia: Secondary | ICD-10-CM | POA: Diagnosis not present

## 2016-12-07 NOTE — Progress Notes (Signed)
Pre visit review using our clinic review tool, if applicable. No additional management support is needed unless otherwise documented below in the visit note. 
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
## 2017-03-02 ENCOUNTER — Encounter: Payer: Self-pay | Admitting: Family Medicine

## 2017-03-03 ENCOUNTER — Encounter: Payer: Self-pay | Admitting: Family Medicine

## 2017-03-03 MED ORDER — FLUOXETINE HCL 40 MG PO CAPS: 40.0000 mg | ORAL_CAPSULE | Freq: Every day | ORAL | 2 refills | Status: DC

## 2017-03-03 MED ORDER — BENAZEPRIL HCL 40 MG PO TABS: 40.0000 mg | ORAL_TABLET | Freq: Every day | ORAL | 2 refills | Status: DC

## 2017-03-03 MED ORDER — SILDENAFIL CITRATE 100 MG PO TABS: 100.0000 mg | ORAL_TABLET | ORAL | 3 refills | Status: DC | PRN

## 2017-03-03 MED ORDER — AMLODIPINE BESYLATE 10 MG PO TABS: 10.0000 mg | ORAL_TABLET | Freq: Every day | ORAL | 2 refills | Status: DC

## 2018-02-06 ENCOUNTER — Other Ambulatory Visit: Payer: Self-pay | Admitting: Family Medicine

## 2018-04-21 ENCOUNTER — Ambulatory Visit (INDEPENDENT_AMBULATORY_CARE_PROVIDER_SITE_OTHER): Payer: 59 | Admitting: Family Medicine

## 2018-04-21 ENCOUNTER — Other Ambulatory Visit: Payer: Self-pay

## 2018-04-21 ENCOUNTER — Encounter: Payer: Self-pay | Admitting: Family Medicine

## 2018-04-21 ENCOUNTER — Ambulatory Visit (INDEPENDENT_AMBULATORY_CARE_PROVIDER_SITE_OTHER)
Admission: RE | Admit: 2018-04-21 | Discharge: 2018-04-21 | Disposition: A | Discharge status: Home / Self Care | Payer: 59 | Source: Ambulatory Visit | Attending: Family Medicine | Admitting: Family Medicine

## 2018-04-21 VITALS — BP 100/74 | HR 91 | Temp 98.8°F | Ht 71.0 in | Wt 223.5 lb

## 2018-04-21 DIAGNOSIS — Z0000 Encounter for general adult medical examination without abnormal findings: Secondary | ICD-10-CM

## 2018-04-21 DIAGNOSIS — Z136 Encounter for screening for cardiovascular disorders: Secondary | ICD-10-CM | POA: Diagnosis not present

## 2018-04-21 DIAGNOSIS — M5416 Radiculopathy, lumbar region: Secondary | ICD-10-CM

## 2018-04-21 DIAGNOSIS — Z720 Tobacco use: Secondary | ICD-10-CM

## 2018-04-21 LAB — CBC WITH DIFFERENTIAL/PLATELET
BASOS ABS: 0.1 10*3/uL (ref 0.0–0.1)
Basophils Relative: 1 % (ref 0.0–3.0)
EOS ABS: 0.3 10*3/uL (ref 0.0–0.7)
Eosinophils Relative: 3.8 % (ref 0.0–5.0)
HCT: 48.1 % (ref 39.0–52.0)
Hemoglobin: 16.3 g/dL (ref 13.0–17.0)
Lymphocytes Relative: 33.4 % (ref 12.0–46.0)
Lymphs Abs: 2.8 10*3/uL (ref 0.7–4.0)
MCHC: 33.9 g/dL (ref 30.0–36.0)
MCV: 83.4 fl (ref 78.0–100.0)
MONO ABS: 0.6 10*3/uL (ref 0.1–1.0)
Monocytes Relative: 7.3 % (ref 3.0–12.0)
NEUTROS ABS: 4.6 10*3/uL (ref 1.4–7.7)
NEUTROS PCT: 54.5 % (ref 43.0–77.0)
PLATELETS: 245 10*3/uL (ref 150.0–400.0)
RBC: 5.77 Mil/uL (ref 4.22–5.81)
RDW: 13.9 % (ref 11.5–15.5)
WBC: 8.5 10*3/uL (ref 4.0–10.5)

## 2018-04-21 LAB — PSA: PSA: 1.29 ng/mL (ref 0.10–4.00)

## 2018-04-21 LAB — LIPID PANEL
Cholesterol: 200 mg/dL (ref 0–200)
HDL: 28.5 mg/dL — AB (ref >39.00)
Total CHOL/HDL Ratio: 7

## 2018-04-21 LAB — BASIC METABOLIC PANEL
BUN: 16 mg/dL (ref 6–23)
CALCIUM: 9.7 mg/dL (ref 8.4–10.5)
CO2: 28 meq/L (ref 19–32)
CREATININE: 0.76 mg/dL (ref 0.40–1.50)
Chloride: 101 mEq/L (ref 96–112)
GFR: 113.62 mL/min (ref >60.00)
GLUCOSE: 94 mg/dL (ref 70–99)
Potassium: 4.2 mEq/L (ref 3.5–5.1)
Sodium: 137 mEq/L (ref 135–145)

## 2018-04-21 LAB — HEPATIC FUNCTION PANEL
ALBUMIN: 4.7 g/dL (ref 3.5–5.2)
ALK PHOS: 74 U/L (ref 39–117)
ALT: 40 U/L (ref 0–53)
AST: 29 U/L (ref 0–37)
BILIRUBIN DIRECT: 0.1 mg/dL (ref 0.0–0.3)
Total Bilirubin: 0.6 mg/dL (ref 0.2–1.2)
Total Protein: 7.2 g/dL (ref 6.0–8.3)

## 2018-04-21 LAB — LDL CHOLESTEROL, DIRECT: LDL DIRECT: 83 mg/dL

## 2018-04-21 MED ORDER — PREDNISONE 20 MG PO TABS: ORAL_TABLET | ORAL | 0 refills | Status: DC

## 2018-04-21 MED ORDER — TIZANIDINE HCL 4 MG PO TABS: 4.0000 mg | ORAL_TABLET | Freq: Every evening | ORAL | 2 refills | Status: DC

## 2018-04-21 MED ORDER — FLUOXETINE HCL 40 MG PO CAPS: 80.0000 mg | ORAL_CAPSULE | Freq: Every day | ORAL | 3 refills | Status: DC

## 2018-04-21 NOTE — Patient Instructions (Addendum)
Call me in 5 weeks - let me know how your back is doing.   REFERRALS TO SPECIALISTS, SPECIAL TESTS (MRI, CT, ULTRASOUNDS)  [facility] or  [facility] will help you. ASK CHECK-IN FOR HELP.  Specialist appointment times vary a great deal, based on their schedule / openings. -- Some specialists have very long wait times. (Example. Dermatology)
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
## 2018-04-29 ENCOUNTER — Ambulatory Visit: Payer: 59 | Admitting: Physical Therapy

## 2018-05-06 ENCOUNTER — Ambulatory Visit (INDEPENDENT_AMBULATORY_CARE_PROVIDER_SITE_OTHER)
Admission: RE | Admit: 2018-05-06 | Discharge: 2018-05-06 | Disposition: A | Discharge status: Home / Self Care | Payer: Self-pay | Source: Ambulatory Visit | Attending: Family Medicine | Admitting: Family Medicine

## 2018-05-06 DIAGNOSIS — Z720 Tobacco use: Secondary | ICD-10-CM

## 2018-05-08 ENCOUNTER — Other Ambulatory Visit: Payer: Self-pay | Admitting: Family Medicine

## 2018-05-10 ENCOUNTER — Encounter: Payer: Self-pay | Admitting: Family Medicine

## 2018-05-10 ENCOUNTER — Other Ambulatory Visit: Payer: Self-pay | Admitting: Family Medicine

## 2018-05-10 MED ORDER — ROSUVASTATIN CALCIUM 40 MG PO TABS: 40.0000 mg | ORAL_TABLET | Freq: Every day | ORAL | 3 refills | Status: DC

## 2018-05-10 NOTE — Progress Notes (Signed)
chol

## 2018-07-13 ENCOUNTER — Other Ambulatory Visit: Payer: Self-pay | Admitting: Family Medicine

## 2018-07-13 NOTE — Telephone Encounter (Signed)
Last office visit 04/21/2018 for CPE.  Last refilled 04/21/2018 for #30 with 2 refills.  Ok to refill?

## 2018-08-09 ENCOUNTER — Encounter: Payer: Self-pay | Admitting: Family Medicine

## 2018-08-10 ENCOUNTER — Other Ambulatory Visit: Payer: Self-pay | Admitting: Family Medicine

## 2018-08-10 DIAGNOSIS — E785 Hyperlipidemia, unspecified: Secondary | ICD-10-CM

## 2018-08-10 DIAGNOSIS — Z79899 Other long term (current) drug therapy: Secondary | ICD-10-CM

## 2018-08-10 MED ORDER — PREDNISONE 20 MG PO TABS: ORAL_TABLET | ORAL | 0 refills | Status: DC

## 2018-08-10 NOTE — Telephone Encounter (Signed)
Paul Vazquez,   This is the patient who would like his labs drawn at [facility].  FLP, E78.5  hyperlipidemia  HFP: Z79.899 long-term medication usage

## 2018-09-21 ENCOUNTER — Other Ambulatory Visit (INDEPENDENT_AMBULATORY_CARE_PROVIDER_SITE_OTHER): Payer: 59

## 2018-09-21 DIAGNOSIS — Z79899 Other long term (current) drug therapy: Secondary | ICD-10-CM

## 2018-09-21 DIAGNOSIS — E785 Hyperlipidemia, unspecified: Secondary | ICD-10-CM

## 2018-09-21 LAB — HEPATIC FUNCTION PANEL
ALK PHOS: 66 U/L (ref 39–117)
ALT: 41 U/L (ref 0–53)
AST: 30 U/L (ref 0–37)
Albumin: 4.5 g/dL (ref 3.5–5.2)
BILIRUBIN DIRECT: 0.1 mg/dL (ref 0.0–0.3)
BILIRUBIN TOTAL: 0.5 mg/dL (ref 0.2–1.2)
Total Protein: 6.8 g/dL (ref 6.0–8.3)

## 2018-09-21 LAB — LIPID PANEL
CHOL/HDL RATIO: 3
Cholesterol: 103 mg/dL (ref 0–200)
HDL: 31.9 mg/dL — AB (ref >39.00)
NonHDL: 71.03
TRIGLYCERIDES: 251 mg/dL — AB (ref 0.0–149.0)
VLDL: 50.2 mg/dL — ABNORMAL HIGH (ref 0.0–40.0)

## 2018-09-21 LAB — LDL CHOLESTEROL, DIRECT: LDL DIRECT: 52 mg/dL

## 2018-09-26 ENCOUNTER — Encounter: Payer: Self-pay | Admitting: Family Medicine

## 2018-10-06 ENCOUNTER — Other Ambulatory Visit: Payer: Self-pay | Admitting: Family Medicine

## 2018-10-06 NOTE — Telephone Encounter (Signed)
Last office visit 04/21/2018 for CPE.  Last refilled 07/13/2018 for #30 with 2 refills.  No future appointments.

## 2018-10-30 ENCOUNTER — Other Ambulatory Visit: Payer: Self-pay | Admitting: Family Medicine

## 2018-10-31 NOTE — Telephone Encounter (Signed)
Last office visit 04/21/2018 for CPE.  Last refilled 10/07/18 for #30 with no refills.  No future appointments.

## 2018-11-14 ENCOUNTER — Encounter: Payer: Self-pay | Admitting: Family Medicine

## 2018-11-14 DIAGNOSIS — M5416 Radiculopathy, lumbar region: Secondary | ICD-10-CM

## 2018-11-30 ENCOUNTER — Other Ambulatory Visit: Payer: Self-pay | Admitting: Orthopedic Surgery

## 2018-11-30 DIAGNOSIS — M5416 Radiculopathy, lumbar region: Secondary | ICD-10-CM

## 2018-11-30 DIAGNOSIS — M545 Low back pain, unspecified: Secondary | ICD-10-CM

## 2018-12-12 ENCOUNTER — Ambulatory Visit
Admission: RE | Admit: 2018-12-12 | Discharge: 2018-12-12 | Disposition: A | Discharge status: Home / Self Care | Payer: 59 | Source: Ambulatory Visit | Attending: Orthopedic Surgery | Admitting: Orthopedic Surgery

## 2018-12-12 DIAGNOSIS — M5416 Radiculopathy, lumbar region: Secondary | ICD-10-CM

## 2018-12-12 DIAGNOSIS — M545 Low back pain, unspecified: Secondary | ICD-10-CM

## 2019-02-06 ENCOUNTER — Encounter: Payer: Self-pay | Admitting: Family Medicine

## 2019-02-06 MED ORDER — LOSARTAN POTASSIUM 50 MG PO TABS: 50.0000 mg | ORAL_TABLET | Freq: Every day | ORAL | 1 refills | Status: DC

## 2019-02-06 NOTE — Telephone Encounter (Signed)
Not unreasonable.   Benazapril that he is on is kind of like the father to losartan.  Losartan is an angiotensin receptor blocker.   Benazapril is an ACE inhibitor.  Little in terms of outcomes difference, but Losartan may have a more favorable side effect profile.  D/c benazapril  Losartan 50 mg po daily, #90, 1 ref

## 2019-02-10 ENCOUNTER — Other Ambulatory Visit: Payer: Self-pay | Admitting: *Deleted

## 2019-02-10 MED ORDER — ROSUVASTATIN CALCIUM 40 MG PO TABS: 40.0000 mg | ORAL_TABLET | Freq: Every day | ORAL | 0 refills | Status: DC

## 2019-02-10 MED ORDER — LOSARTAN POTASSIUM 50 MG PO TABS: 50.0000 mg | ORAL_TABLET | Freq: Every day | ORAL | 0 refills | Status: DC

## 2019-02-10 MED ORDER — AMLODIPINE BESYLATE 10 MG PO TABS: 10.0000 mg | ORAL_TABLET | Freq: Every day | ORAL | 0 refills | Status: DC

## 2019-04-12 ENCOUNTER — Other Ambulatory Visit: Payer: Self-pay | Admitting: Family Medicine

## 2019-05-02 ENCOUNTER — Other Ambulatory Visit: Payer: Self-pay | Admitting: Family Medicine

## 2019-05-02 NOTE — Telephone Encounter (Signed)
Please schedule CPE with fasting lab for Dr. Lorelei Pont.

## 2019-05-11 NOTE — Telephone Encounter (Signed)
Left message asking pt to call office  °

## 2019-05-11 NOTE — Telephone Encounter (Signed)
Spoke with pt he wanted to wait till pandemic is over.  He stated he would call back to schedule

## 2019-06-09 ENCOUNTER — Other Ambulatory Visit: Payer: Self-pay | Admitting: Family Medicine

## 2019-06-12 NOTE — Telephone Encounter (Signed)
Patient need to schedule an ov for more refills. 

## 2019-06-16 ENCOUNTER — Other Ambulatory Visit: Payer: Self-pay | Admitting: Family Medicine

## 2019-06-20 ENCOUNTER — Encounter: Payer: Self-pay | Admitting: Family Medicine

## 2019-06-20 MED ORDER — LOSARTAN POTASSIUM 50 MG PO TABS: 50.0000 mg | ORAL_TABLET | Freq: Every day | ORAL | 0 refills | Status: DC

## 2019-06-20 MED ORDER — ROSUVASTATIN CALCIUM 40 MG PO TABS: 40.0000 mg | ORAL_TABLET | Freq: Every day | ORAL | 0 refills | Status: DC

## 2019-06-20 MED ORDER — AMLODIPINE BESYLATE 10 MG PO TABS: 10.0000 mg | ORAL_TABLET | Freq: Every day | ORAL | 0 refills | Status: DC

## 2019-06-20 NOTE — Addendum Note (Signed)
Addended by: Carter Kitten on: 06/20/2019 02:21 PM   Modules accepted: Orders

## 2019-06-26 ENCOUNTER — Encounter: Payer: Self-pay | Admitting: Family Medicine

## 2019-06-26 DIAGNOSIS — M2550 Pain in unspecified joint: Secondary | ICD-10-CM

## 2019-06-26 DIAGNOSIS — R509 Fever, unspecified: Secondary | ICD-10-CM

## 2019-06-27 ENCOUNTER — Other Ambulatory Visit: Payer: Self-pay

## 2019-06-27 DIAGNOSIS — Z20822 Contact with and (suspected) exposure to covid-19: Secondary | ICD-10-CM

## 2019-06-28 ENCOUNTER — Encounter: Payer: Self-pay | Admitting: Family Medicine

## 2019-06-28 LAB — NOVEL CORONAVIRUS, NAA: SARS-CoV-2, NAA: NOT DETECTED

## 2019-07-03 ENCOUNTER — Encounter: Payer: Self-pay | Admitting: Family Medicine

## 2019-07-03 NOTE — Telephone Encounter (Signed)
Can y'all set him up for a doxy visit with someone tomorrow - I am full today

## 2019-07-03 NOTE — Telephone Encounter (Signed)
Left message asking pt to call office  °

## 2019-07-05 ENCOUNTER — Ambulatory Visit (INDEPENDENT_AMBULATORY_CARE_PROVIDER_SITE_OTHER): Payer: 59 | Admitting: Family Medicine

## 2019-07-05 ENCOUNTER — Encounter: Payer: Self-pay | Admitting: Family Medicine

## 2019-07-05 ENCOUNTER — Other Ambulatory Visit: Payer: Self-pay

## 2019-07-05 VITALS — BP 142/85 | HR 83 | Temp 98.6°F | Ht 71.0 in | Wt 235.0 lb

## 2019-07-05 DIAGNOSIS — J301 Allergic rhinitis due to pollen: Secondary | ICD-10-CM

## 2019-07-05 DIAGNOSIS — J0100 Acute maxillary sinusitis, unspecified: Secondary | ICD-10-CM | POA: Diagnosis not present

## 2019-07-05 MED ORDER — AMOXICILLIN-POT CLAVULANATE 875-125 MG PO TABS: 1.0000 | ORAL_TABLET | Freq: Two times a day (BID) | ORAL | 0 refills | Status: AC

## 2019-07-05 MED ORDER — FLUTICASONE PROPIONATE 50 MCG/ACT NA SUSP: 2.0000 | Freq: Every day | NASAL | 2 refills | Status: DC

## 2019-07-05 NOTE — Progress Notes (Signed)
     Jing Howatt T. Jagar Lua, MD Primary Care and [place] at [facility] [place], [address] Phone: [phone]  FAX: [phone]  LANDIN TALLON - 53 y.o. male  MRN [id number]  Date of Birth: 24-Jul-1966  Visit Date: 07/05/2019  PCP: Owens Loffler, MD  Referred by: Owens Loffler, MD Chief Complaint  Patient presents with  . Sinusitis  . Fever   Virtual Visit via Video Note:  I connected with  Windell Hummingbird on 07/05/2019 12:00 PM EDT by a video enabled telemedicine application and verified that I am speaking with the correct person using two identifiers.   Location patient: home computer, tablet, or smartphone Location provider: work or home office Consent: Verbal consent directly obtained from Windell Hummingbird. Persons participating in the virtual visit: patient, provider  I discussed the limitations of evaluation and management by telemedicine and the availability of in person appointments. The patient expressed understanding and agreed to proceed.  History of Present Illness:  He has been having pressure and pain in the maxillary sinus region.  He has been having post-nasal drip and allergic conjunctivitis for months  Review of Systems as above: See pertinent positives and pertinent negatives per HPI No acute distress verbally  Past Medical History, Surgical History, Social History, Family History, Problem List, Medications, and Allergies have been reviewed and updated if relevant.   Observations/Objective/Exam:  An attempt was made to discern vital signs over the phone and per patient if applicable and possible.   General:    Alert, Oriented, appears well and in no acute distress HEENT:     Atraumatic, conjunctiva clear, no obvious abnormalities on inspection of external nose and ears.  Neck:    Normal movements of the head and neck Pulmonary:     On inspection no signs of respiratory distress, breathing  rate appears normal, no obvious gross SOB, gasping or wheezing Cardiovascular:    No obvious cyanosis Musculoskeletal:    Moves all visible extremities without noticeable abnormality Psych / Neurological:     Pleasant and cooperative, no obvious depression or anxiety, speech and thought processing grossly intact  Assessment and Plan:    ICD-10-CM   1. Acute non-recurrent maxillary sinusitis  J01.00   2. Seasonal allergic rhinitis due to pollen  J30.1    Sinusitis AR rx  I discussed the assessment and treatment plan with the patient. The patient was provided an opportunity to ask questions and all were answered. The patient agreed with the plan and demonstrated an understanding of the instructions.   The patient was advised to call back or seek an in-person evaluation if the symptoms worsen or if the condition fails to improve as anticipated.  Follow-up: prn unless noted otherwise below No follow-ups on file.  Meds ordered this encounter  Medications  . amoxicillin-clavulanate (AUGMENTIN) 875-125 MG tablet    Sig: Take 1 tablet by mouth 2 (two) times daily for 10 days.    Dispense:  20 tablet    Refill:  0  . fluticasone (FLONASE) 50 MCG/ACT nasal spray    Sig: Place 2 sprays into both nostrils daily.    Dispense:  16 g    Refill:  2   No orders of the defined types were placed in this encounter.   Signed,  Maud Deed. Reata Petrov, MD

## 2019-09-05 ENCOUNTER — Other Ambulatory Visit: Payer: Self-pay | Admitting: Family Medicine

## 2019-09-05 NOTE — Telephone Encounter (Signed)
Last office visit 07/05/2019 for acute non-recurrent sinusitis.  Last CPE 04/21/2018.  No future appointments.  Ok to refill?

## 2019-09-08 ENCOUNTER — Other Ambulatory Visit: Payer: Self-pay | Admitting: Orthopedic Surgery

## 2019-09-08 DIAGNOSIS — M545 Low back pain, unspecified: Secondary | ICD-10-CM

## 2019-09-24 ENCOUNTER — Other Ambulatory Visit: Payer: Self-pay

## 2019-09-24 ENCOUNTER — Ambulatory Visit
Admission: RE | Admit: 2019-09-24 | Discharge: 2019-09-24 | Disposition: A | Discharge status: Home / Self Care | Payer: 59 | Source: Ambulatory Visit | Attending: Orthopedic Surgery | Admitting: Orthopedic Surgery

## 2019-09-24 DIAGNOSIS — M545 Low back pain, unspecified: Secondary | ICD-10-CM

## 2019-10-03 ENCOUNTER — Other Ambulatory Visit: Payer: Self-pay | Admitting: Family Medicine

## 2019-11-05 ENCOUNTER — Encounter: Payer: Self-pay | Admitting: Family Medicine

## 2019-11-05 ENCOUNTER — Other Ambulatory Visit: Payer: Self-pay | Admitting: Family Medicine

## 2019-11-06 MED ORDER — ROSUVASTATIN CALCIUM 40 MG PO TABS: 40.0000 mg | ORAL_TABLET | Freq: Every day | ORAL | 0 refills | Status: DC

## 2019-11-27 ENCOUNTER — Other Ambulatory Visit: Payer: Self-pay | Admitting: Family Medicine

## 2019-11-27 NOTE — Telephone Encounter (Signed)
Last office visit 07/05/2019 a virtual visit for sinusitis.  Last CPE 04/21/2018.  I did send patient a MyChart message asking that he call and schedule his CPE.  No future appointments.  Refill?

## 2019-12-25 ENCOUNTER — Other Ambulatory Visit: Payer: Self-pay | Admitting: Family Medicine

## 2020-01-11 ENCOUNTER — Other Ambulatory Visit: Payer: Self-pay | Admitting: Family Medicine

## 2020-01-11 DIAGNOSIS — Z0000 Encounter for general adult medical examination without abnormal findings: Secondary | ICD-10-CM

## 2020-01-11 DIAGNOSIS — Z131 Encounter for screening for diabetes mellitus: Secondary | ICD-10-CM

## 2020-01-12 ENCOUNTER — Other Ambulatory Visit (INDEPENDENT_AMBULATORY_CARE_PROVIDER_SITE_OTHER): Payer: 59

## 2020-01-12 ENCOUNTER — Other Ambulatory Visit: Payer: Self-pay

## 2020-01-12 DIAGNOSIS — Z0000 Encounter for general adult medical examination without abnormal findings: Secondary | ICD-10-CM | POA: Diagnosis not present

## 2020-01-12 DIAGNOSIS — Z131 Encounter for screening for diabetes mellitus: Secondary | ICD-10-CM

## 2020-01-12 LAB — CBC WITH DIFFERENTIAL/PLATELET
Basophils Absolute: 0.1 10*3/uL (ref 0.0–0.1)
Basophils Relative: 1.1 % (ref 0.0–3.0)
Eosinophils Absolute: 0.3 10*3/uL (ref 0.0–0.7)
Eosinophils Relative: 4.3 % (ref 0.0–5.0)
HCT: 45.1 % (ref 39.0–52.0)
Hemoglobin: 15.5 g/dL (ref 13.0–17.0)
Lymphocytes Relative: 35.1 % (ref 12.0–46.0)
Lymphs Abs: 2.5 10*3/uL (ref 0.7–4.0)
MCHC: 34.4 g/dL (ref 30.0–36.0)
MCV: 82.5 fl (ref 78.0–100.0)
Monocytes Absolute: 0.6 10*3/uL (ref 0.1–1.0)
Monocytes Relative: 8.7 % (ref 3.0–12.0)
Neutro Abs: 3.7 10*3/uL (ref 1.4–7.7)
Neutrophils Relative %: 50.8 % (ref 43.0–77.0)
Platelets: 214 10*3/uL (ref 150.0–400.0)
RBC: 5.47 Mil/uL (ref 4.22–5.81)
RDW: 14 % (ref 11.5–15.5)
WBC: 7.2 10*3/uL (ref 4.0–10.5)

## 2020-01-12 LAB — BASIC METABOLIC PANEL
BUN: 15 mg/dL (ref 6–23)
CO2: 28 mEq/L (ref 19–32)
Calcium: 9.6 mg/dL (ref 8.4–10.5)
Chloride: 105 mEq/L (ref 96–112)
Creatinine, Ser: 0.73 mg/dL (ref 0.40–1.50)
GFR: 111.27 mL/min (ref >60.00)
Glucose, Bld: 95 mg/dL (ref 70–99)
Potassium: 4.2 mEq/L (ref 3.5–5.1)
Sodium: 139 mEq/L (ref 135–145)

## 2020-01-12 LAB — LIPID PANEL
Cholesterol: 106 mg/dL (ref 0–200)
HDL: 28 mg/dL — ABNORMAL LOW (ref >39.00)
NonHDL: 78.12
Total CHOL/HDL Ratio: 4
Triglycerides: 201 mg/dL — ABNORMAL HIGH (ref 0.0–149.0)
VLDL: 40.2 mg/dL — ABNORMAL HIGH (ref 0.0–40.0)

## 2020-01-12 LAB — HEPATIC FUNCTION PANEL
ALT: 53 U/L (ref 0–53)
AST: 38 U/L — ABNORMAL HIGH (ref 0–37)
Albumin: 4.4 g/dL (ref 3.5–5.2)
Alkaline Phosphatase: 76 U/L (ref 39–117)
Bilirubin, Direct: 0.1 mg/dL (ref 0.0–0.3)
Total Bilirubin: 0.5 mg/dL (ref 0.2–1.2)
Total Protein: 7.1 g/dL (ref 6.0–8.3)

## 2020-01-12 LAB — LDL CHOLESTEROL, DIRECT: Direct LDL: 57 mg/dL

## 2020-01-12 NOTE — Addendum Note (Signed)
Addended by: Cloyd Stagers on: 01/12/2020 08:28 AM   Modules accepted: Orders

## 2020-01-15 LAB — HEMOGLOBIN A1C
Hgb A1c MFr Bld: 5.6 % of total Hgb (ref <5.7)
Mean Plasma Glucose: 114 (calc)
eAG (mmol/L): 6.3 (calc)

## 2020-01-15 LAB — PSA, TOTAL WITH REFLEX TO PSA, FREE: PSA, Total: 1.4 ng/mL (ref <=4.0)

## 2020-01-17 ENCOUNTER — Encounter: Payer: 59 | Admitting: Family Medicine

## 2020-02-25 ENCOUNTER — Other Ambulatory Visit: Payer: Self-pay | Admitting: Family Medicine

## 2020-03-03 ENCOUNTER — Other Ambulatory Visit: Payer: Self-pay | Admitting: Family Medicine

## 2020-03-08 ENCOUNTER — Other Ambulatory Visit: Payer: Self-pay | Admitting: *Deleted

## 2020-03-08 MED ORDER — AMLODIPINE BESYLATE 10 MG PO TABS: 10.0000 mg | ORAL_TABLET | Freq: Every day | ORAL | 0 refills | Status: DC

## 2020-03-18 ENCOUNTER — Encounter: Payer: Self-pay | Admitting: Family Medicine

## 2020-03-18 ENCOUNTER — Other Ambulatory Visit: Payer: Self-pay

## 2020-03-18 ENCOUNTER — Ambulatory Visit (INDEPENDENT_AMBULATORY_CARE_PROVIDER_SITE_OTHER): Payer: 59 | Admitting: Family Medicine

## 2020-03-18 VITALS — BP 114/82 | HR 69 | Temp 98.4°F | Ht 70.75 in | Wt 230.8 lb

## 2020-03-18 DIAGNOSIS — L989 Disorder of the skin and subcutaneous tissue, unspecified: Secondary | ICD-10-CM

## 2020-03-18 DIAGNOSIS — Z0000 Encounter for general adult medical examination without abnormal findings: Secondary | ICD-10-CM
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
## 2020-05-13 ENCOUNTER — Other Ambulatory Visit: Payer: Self-pay | Admitting: Family Medicine

## 2020-05-21 ENCOUNTER — Encounter: Payer: Self-pay | Admitting: Gastroenterology

## 2020-05-25 ENCOUNTER — Other Ambulatory Visit: Payer: Self-pay | Admitting: Family Medicine

## 2020-06-19 ENCOUNTER — Encounter: Payer: Self-pay | Admitting: Family Medicine

## 2020-06-19 NOTE — Telephone Encounter (Signed)
Set up an appointment with one of my partners

## 2020-06-19 NOTE — Telephone Encounter (Signed)
vitual appointment 8/5 Pt aware

## 2020-06-20 ENCOUNTER — Encounter: Payer: Self-pay | Admitting: Family Medicine

## 2020-06-20 ENCOUNTER — Telehealth (INDEPENDENT_AMBULATORY_CARE_PROVIDER_SITE_OTHER): Payer: 59 | Admitting: Family Medicine

## 2020-06-20 ENCOUNTER — Other Ambulatory Visit: Payer: Self-pay

## 2020-06-20 VITALS — BP 124/76 | HR 91 | Temp 98.7°F | Ht 70.75 in | Wt 224.5 lb

## 2020-06-20 DIAGNOSIS — R509 Fever, unspecified: Secondary | ICD-10-CM | POA: Diagnosis not present

## 2020-06-20 MED ORDER — PREDNISONE 20 MG PO TABS: ORAL_TABLET | ORAL | 0 refills | Status: DC

## 2020-06-20 MED ORDER — AMOXICILLIN 500 MG PO CAPS: 1000.0000 mg | ORAL_CAPSULE | Freq: Two times a day (BID) | ORAL | 0 refills | Status: DC

## 2020-06-20 NOTE — Assessment & Plan Note (Signed)
Given s/p COIVID vaccine and negative home COVID test.. bacterial sinus infection most likely cause of fever.  No neck pain, no cough and no rash.  Treat with pred taper and Amox x 10 days.  if not improving in 48-72 hours.. recommend COVID testing PCR and in person eval.  Return precautions reviewed and recommended isolation until improving.

## 2020-06-20 NOTE — Patient Instructions (Signed)
Zyrtec at bedtime. Complete prednisone taper and course of antibiotics.  Hold flonase while on prednisone.  Isolate until symptoms  improving.  If not better in 48-72 hours consider PCR COVID test and possibly urgent care in person evaluation.

## 2020-06-20 NOTE — Progress Notes (Signed)
VIRTUAL VISIT Due to national recommendations of social distancing due to [place] 19, a virtual visit is felt to be most appropriate for this patient at this time.   I connected with the patient on 06/20/20 at  2:20 PM EDT by virtual telehealth platform and verified that I am speaking with the correct person using two identifiers.   I discussed the limitations, risks, security and privacy concerns of performing an evaluation and management service by  virtual telehealth platform and the availability of in person appointments. I also discussed with the patient that there may be a patient responsible charge related to this service. The patient expressed understanding and agreed to proceed.  Patient location: Home Provider Location: [facility] Participants: Eliezer Lofts and Windell Hummingbird   Chief Complaint  Patient presents with  . Chills  . Fever  . Dental Pain  . Nasal Congestion    History of Present Illness:  54 year old male patient with history of HTN presents with chills , fever and nasal congestion.   He reports he was doing well until last 48 hours. He noted body aches, upper teeth achy bilaterally..  Started with nasal congestion. Fever started.Marland Kitchen temp 101 F.  No cough.  No loss of taste and smell NO N/V no D. No face pain, some facial pressure no ear pain. He is fatigue.  no SOB, no wheeze. He has had some background  Congestion and post nasal drip.Marland Kitchen months.  Home COVID test negative.. day before yesterday.   He has use tylenol.. helps minimally.  HAs not stopped flonase until 1 week ago.   No sick contacts.   He is s/p COVID19 vaccine series.  COVID 19 screen No recent travel or known exposure to [place] The importance of social distancing was discussed today.   Review of Systems  Constitutional: Positive for chills and fever.  HENT: Positive for congestion and sinus pain. Negative for ear pain and sore throat.   Eyes: Negative for pain and redness.   Respiratory: Negative for cough and shortness of breath.   Cardiovascular: Negative for chest pain, palpitations and leg swelling.  Gastrointestinal: Negative for abdominal pain, blood in stool, constipation, diarrhea, nausea and vomiting.  Genitourinary: Negative for dysuria.  Musculoskeletal: Negative for falls and myalgias.  Skin: Negative for rash.  Neurological: Negative for dizziness.  Psychiatric/Behavioral: Negative for depression. The patient is not nervous/anxious.       Past Medical History:  Diagnosis Date  . Anxiety   . Hypertension     reports that he quit smoking about 4 years ago. His smoking use included cigarettes. He smoked 1.00 pack per day. He has never used smokeless tobacco. He reports that he does not drink alcohol and does not use drugs.   Current Outpatient Medications:  .  amLODipine (NORVASC) 10 MG tablet, TAKE 1 TABLET DAILY, Disp: 90 tablet, Rfl: 1 .  aspirin 81 MG tablet, Take 81 mg by mouth daily., Disp: , Rfl:  .  doxylamine, Sleep, (UNISOM) 25 MG tablet, Take 25 mg by mouth at bedtime as needed., Disp: , Rfl:  .  FLUoxetine (PROZAC) 40 MG capsule, ALTERNATE 1 CAPSULE (40MG ) EVERY OTHER DAY AND 2 CAPSULES (80MG ) EVERY OTHERDAY, Disp: 135 capsule, Rfl: 1 .  fluticasone (FLONASE) 50 MCG/ACT nasal spray, Place 2 sprays into both nostrils daily., Disp: , Rfl:  .  losartan (COZAAR) 50 MG tablet, TAKE 1 TABLET DAILY, Disp: 90 tablet, Rfl: 1 .  meloxicam (MOBIC) 15 MG tablet, ,  Disp: , Rfl:  .  methocarbamol (ROBAXIN) 500 MG tablet, , Disp: , Rfl:  .  Multiple Vitamin (MULTIVITAMIN) tablet, Take 1 tablet by mouth daily., Disp: , Rfl:  .  rosuvastatin (CRESTOR) 40 MG tablet, TAKE 1 TABLET DAILY, Disp: 90 tablet, Rfl: 2 .  sildenafil (VIAGRA) 100 MG tablet, Take 1 tablet (100 mg total) by mouth as needed for erectile dysfunction., Disp: 18 tablet, Rfl: 3   Observations/Objective: Blood pressure 124/76, pulse 91, temperature 98.7 F (37.1 C), temperature  source Oral, height 5' 10.75" (1.797 m), weight 224 lb 8 oz (101.8 kg).  Physical Exam  Physical Exam Constitutional:      General: The patient is not in acute distress. brething and speaking comfortably. Full motion of neck. Pulmonary:     Effort: Pulmonary effort is normal. No respiratory distress.  Neurological:     Mental Status: The patient is alert and oriented to person, place, and time.  Psychiatric:        Mood and Affect: Mood normal.        Behavior: Behavior normal.   Assessment and Plan Fever Given s/p COIVID vaccine and negative home COVID test.. bacterial sinus infection most likely cause of fever.  No neck pain, no cough and no rash.  Treat with pred taper and Amox x 10 days.  if not improving in 48-72 hours.. recommend COVID testing PCR and in person eval.  Return precautions reviewed and recommended isolation until improving.      I discussed the assessment and treatment plan with the patient. The patient was provided an opportunity to ask questions and all were answered. The patient agreed with the plan and demonstrated an understanding of the instructions.   The patient was advised to call back or seek an in-person evaluation if the symptoms worsen or if the condition fails to improve as anticipated.     Eliezer Lofts, MD

## 2020-07-05 ENCOUNTER — Other Ambulatory Visit: Payer: Self-pay | Admitting: Family Medicine

## 2020-07-16 ENCOUNTER — Encounter: Payer: 59 | Admitting: Gastroenterology

## 2020-08-27 ENCOUNTER — Encounter: Payer: Self-pay | Admitting: Gastroenterology

## 2020-08-27 ENCOUNTER — Ambulatory Visit (AMBULATORY_SURGERY_CENTER): Payer: Self-pay | Admitting: *Deleted

## 2020-08-27 ENCOUNTER — Other Ambulatory Visit: Payer: Self-pay

## 2020-08-27 VITALS — Ht 70.75 in | Wt 225.0 lb

## 2020-08-27 DIAGNOSIS — Z86010 Personal history of colonic polyps: Secondary | ICD-10-CM

## 2020-08-27 NOTE — Progress Notes (Signed)
Cov vax x 3- had booster   No egg or soy allergy known to patient  No issues with past sedation with any surgeries or procedures no intubation problems in the past  No FH of Malignant Hyperthermia No diet pills per patient No home 02 use per patient  No blood thinners per patient  Pt denies issues with constipation  No A fib or A flutter  EMMI video to pt or via [place] 19 guidelines implemented in PV today with Pt and RN   Pt verified name, DOB, address and insurance during PV today. Pt mailed instruction packet to included paper to complete and mail back to [facility] with addressed and stamped envelope, Emmi video, copy of consent form to read and not return, and instructions.   PV completed over the phone. Pt encouraged to call with questions or issues 0 also sent instructions via my chart    Due to the COVID-19 pandemic we are asking patients to follow these guidelines. Please only bring one care partner. Please be aware that your care partner may wait in the car in the parking lot or if they feel like they will be too hot to wait in the car, they may wait in the lobby on the 4th floor. All care partners are required to wear a mask the entire time (we do not have any that we can provide them), they need to practice social distancing, and we will do a Covid check for all patient's and care partners when you arrive. Also we will check their temperature and your temperature. If the care partner waits in their car they need to stay in the parking lot the entire time and we will call them on their cell phone when the patient is ready for discharge so they can bring the car to the front of the building. Also all patient's will need to wear a mask into building.

## 2020-09-10 ENCOUNTER — Other Ambulatory Visit: Payer: Self-pay

## 2020-09-10 ENCOUNTER — Encounter: Payer: Self-pay | Admitting: Gastroenterology

## 2020-09-10 ENCOUNTER — Ambulatory Visit (AMBULATORY_SURGERY_CENTER): Payer: 59 | Admitting: Gastroenterology

## 2020-09-10 VITALS — BP 132/83 | HR 59 | Temp 96.9°F | Resp 16 | Ht 70.0 in | Wt 225.0 lb

## 2020-09-10 DIAGNOSIS — Z86010 Personal history of colonic polyps: Secondary | ICD-10-CM | POA: Diagnosis not present

## 2020-09-10 MED ORDER — SODIUM CHLORIDE 0.9 % IV SOLN: 500.0000 mL | Freq: Once | INTRAVENOUS | Status: DC

## 2020-09-10 NOTE — Op Note (Signed)
Paul Vazquez Patient Name: Paul Vazquez Procedure Date: 09/10/2020 8:07 AM MRN: [id number] Endoscopist: Milus Banister , MD Age: 54 Referring MD:  Date of Birth: Feb 14, 1966 Gender: Male Account #: [id number] Procedure:                Colonoscopy Indications:              High risk colon cancer surveillance: Personal                            history of colonic polyps; Colonoscopy 2016 single                            subCM SSP Medicines:                Monitored Anesthesia Care Procedure:                Pre-Anesthesia Assessment:                           - Prior to the procedure, a History and Physical                            was performed, and patient medications and                            allergies were reviewed. The patient's tolerance of                            previous anesthesia was also reviewed. The risks                            and benefits of the procedure and the sedation                            options and risks were discussed with the patient.                            All questions were answered, and informed consent                            was obtained. Prior Anticoagulants: The patient has                            taken no previous anticoagulant or antiplatelet                            agents. ASA Grade Assessment: II - A patient with                            mild systemic disease. After reviewing the risks                            and benefits, the patient was deemed in  satisfactory condition to undergo the procedure.                           After obtaining informed consent, the colonoscope                            was passed under direct vision. Throughout the                            procedure, the patient's blood pressure, pulse, and                            oxygen saturations were monitored continuously. The                            Colonoscope was introduced through the anus and                             advanced to the the cecum, identified by                            appendiceal orifice and ileocecal valve. The                            colonoscopy was performed without difficulty. The                            patient tolerated the procedure well. The quality                            of the bowel preparation was good. The ileocecal                            valve, appendiceal orifice, and rectum were                            photographed. Scope In: 8:17:02 AM Scope Out: 8:33:36 AM Scope Withdrawal Time: 0 hours 12 minutes 10 seconds  Total Procedure Duration: 0 hours 16 minutes 34 seconds  Findings:                 Multiple small and large-mouthed diverticula were                            found in the left colon.                           Internal hemorrhoids were found. The hemorrhoids                            were small.                           The exam was otherwise without abnormality on  direct and retroflexion views. Complications:            No immediate complications. Estimated blood loss:                            None. Estimated Blood Loss:     Estimated blood loss: none. Impression:               - Diverticulosis in the left colon.                           - Internal hemorrhoids.                           - The examination was otherwise normal on direct                            and retroflexion views.                           - No polyps or cancers. Recommendation:           - Patient has a contact number available for                            emergencies. The signs and symptoms of potential                            delayed complications were discussed with the                            patient. Return to normal activities tomorrow.                            Written discharge instructions were provided to the                            patient.                           - Resume previous diet.                            - Continue present medications.                           - Repeat colonoscopy in 7 years for surveillance. Milus Banister, MD 09/10/2020 8:36:02 AM This report has been signed electronically.

## 2020-09-10 NOTE — Patient Instructions (Signed)
HANDOUTS PROVIDED ON: DIVERTICULOSIS & HEMORRHOIDS  You may resume your previous diet and medication schedule.  Thank you for allowing [place] to care for you today!!!   YOU HAD AN ENDOSCOPIC PROCEDURE TODAY AT [place]:   Refer to the procedure report that was given to you for any specific questions about what was found during the examination.  If the procedure report does not answer your questions, please call your gastroenterologist to clarify.  If you requested that your care partner not be given the details of your procedure findings, then the procedure report has been included in a sealed envelope for you to review at your convenience later.  YOU SHOULD EXPECT: Some feelings of bloating in the abdomen. Passage of more gas than usual.  Walking can help get rid of the air that was put into your GI tract during the procedure and reduce the bloating. If you had a lower endoscopy (such as a colonoscopy or flexible sigmoidoscopy) you may notice spotting of blood in your stool or on the toilet paper. If you underwent a bowel prep for your procedure, you may not have a normal bowel movement for a few days.  Please Note:  You might notice some irritation and congestion in your nose or some drainage.  This is from the oxygen used during your procedure.  There is no need for concern and it should clear up in a day or so.  SYMPTOMS TO REPORT IMMEDIATELY:   Following lower endoscopy (colonoscopy or flexible sigmoidoscopy):  Excessive amounts of blood in the stool  Significant tenderness or worsening of abdominal pains  Swelling of the abdomen that is new, acute  Fever of 100F or higher  For urgent or emergent issues, a gastroenterologist can be reached at any hour by calling [phone]. Do not use MyChart messaging for urgent concerns.    DIET:  We do recommend a small meal at first, but then you may proceed to your regular diet.  Drink plenty of fluids but you should avoid  alcoholic beverages for 24 hours.  ACTIVITY:  You should plan to take it easy for the rest of today and you should NOT DRIVE or use heavy machinery until tomorrow (because of the sedation medicines used during the test).    FOLLOW UP: Our staff will call the number listed on your records Thursday between 7:15-8:15 am to check on you and address any questions or concerns that you may have regarding the information given to you following your procedure. If we do not reach you, we will  leave a message.  We will attempt to reach you two times.  During this call, we will ask if you have developed any symptoms of COVID 19. If you develop any symptoms (ie: fever, flu-like symptoms, shortness of breath, cough etc.) before then, please call [phone].  If you test positive for Covid 19 in the 2 weeks post procedure, please call and report this information to [place].    If any biopsies were taken you will be contacted by phone or by letter within the next 1-3 weeks.  Please call us at [phone] if you have not heard about the biopsies in 3 weeks.    SIGNATURES/CONFIDENTIALITY: You and/or your care partner have signed paperwork which will be entered into your electronic medical record.  These signatures attest to the fact that that the information above on your After Visit Summary has been reviewed and is understood.  Full responsibility of the confidentiality  of this discharge information lies with you and/or your care-partner.

## 2020-09-10 NOTE — Progress Notes (Signed)
PT taken to PACU. Monitors in place. VSS. Report given to RN. 

## 2020-09-12 ENCOUNTER — Telehealth: Payer: Self-pay

## 2020-09-12 ENCOUNTER — Telehealth: Payer: Self-pay | Admitting: *Deleted

## 2020-09-12 NOTE — Telephone Encounter (Signed)
No answer for post procedure call back and unable to leave message. SM

## 2020-09-12 NOTE — Telephone Encounter (Signed)
  Follow up Call-  Call back number 09/10/2020  Post procedure Call Back phone  # [phone]  Permission to leave phone message Yes  Some recent data might be hidden    1st follow up call made.  NALM

## 2020-12-19 ENCOUNTER — Other Ambulatory Visit: Payer: Self-pay | Admitting: Family Medicine

## 2020-12-23 ENCOUNTER — Other Ambulatory Visit: Payer: Self-pay | Admitting: Family Medicine

## 2021-03-10 ENCOUNTER — Other Ambulatory Visit: Payer: Self-pay | Admitting: Family Medicine

## 2021-03-14 ENCOUNTER — Other Ambulatory Visit: Payer: Self-pay | Admitting: Family Medicine

## 2021-03-14 DIAGNOSIS — Z131 Encounter for screening for diabetes mellitus: Secondary | ICD-10-CM

## 2021-03-14 DIAGNOSIS — Z79899 Other long term (current) drug therapy: Secondary | ICD-10-CM

## 2021-03-14 DIAGNOSIS — E785 Hyperlipidemia, unspecified: Secondary | ICD-10-CM

## 2021-03-14 DIAGNOSIS — Z125 Encounter for screening for malignant neoplasm of prostate: Secondary | ICD-10-CM

## 2021-03-17 ENCOUNTER — Other Ambulatory Visit (INDEPENDENT_AMBULATORY_CARE_PROVIDER_SITE_OTHER): Payer: 59

## 2021-03-17 ENCOUNTER — Other Ambulatory Visit: Payer: Self-pay

## 2021-03-17 DIAGNOSIS — E785 Hyperlipidemia, unspecified: Secondary | ICD-10-CM | POA: Diagnosis not present

## 2021-03-17 DIAGNOSIS — Z131 Encounter for screening for diabetes mellitus: Secondary | ICD-10-CM | POA: Diagnosis not present

## 2021-03-17 DIAGNOSIS — Z125 Encounter for screening for malignant neoplasm of prostate: Secondary | ICD-10-CM

## 2021-03-17 DIAGNOSIS — Z79899 Other long term (current) drug therapy: Secondary | ICD-10-CM | POA: Diagnosis not present

## 2021-03-17 LAB — HEPATIC FUNCTION PANEL
ALT: 31 U/L (ref 0–53)
AST: 24 U/L (ref 0–37)
Albumin: 4.5 g/dL (ref 3.5–5.2)
Alkaline Phosphatase: 83 U/L (ref 39–117)
Bilirubin, Direct: 0.1 mg/dL (ref 0.0–0.3)
Total Bilirubin: 0.6 mg/dL (ref 0.2–1.2)
Total Protein: 7.4 g/dL (ref 6.0–8.3)

## 2021-03-17 LAB — BASIC METABOLIC PANEL
BUN: 12 mg/dL (ref 6–23)
CO2: 26 mEq/L (ref 19–32)
Calcium: 9.2 mg/dL (ref 8.4–10.5)
Chloride: 105 mEq/L (ref 96–112)
Creatinine, Ser: 0.8 mg/dL (ref 0.40–1.50)
GFR: 98.69 mL/min (ref >60.00)
Glucose, Bld: 98 mg/dL (ref 70–99)
Potassium: 3.8 mEq/L (ref 3.5–5.1)
Sodium: 140 mEq/L (ref 135–145)

## 2021-03-17 LAB — CBC WITH DIFFERENTIAL/PLATELET
Basophils Absolute: 0.1 10*3/uL (ref 0.0–0.1)
Basophils Relative: 0.7 % (ref 0.0–3.0)
Eosinophils Absolute: 0.4 10*3/uL (ref 0.0–0.7)
Eosinophils Relative: 5.1 % — ABNORMAL HIGH (ref 0.0–5.0)
HCT: 45.7 % (ref 39.0–52.0)
Hemoglobin: 15.8 g/dL (ref 13.0–17.0)
Lymphocytes Relative: 28 % (ref 12.0–46.0)
Lymphs Abs: 2.3 10*3/uL (ref 0.7–4.0)
MCHC: 34.6 g/dL (ref 30.0–36.0)
MCV: 81.3 fl (ref 78.0–100.0)
Monocytes Absolute: 0.6 10*3/uL (ref 0.1–1.0)
Monocytes Relative: 7.5 % (ref 3.0–12.0)
Neutro Abs: 4.9 10*3/uL (ref 1.4–7.7)
Neutrophils Relative %: 58.7 % (ref 43.0–77.0)
Platelets: 208 10*3/uL (ref 150.0–400.0)
RBC: 5.62 Mil/uL (ref 4.22–5.81)
RDW: 13.9 % (ref 11.5–15.5)
WBC: 8.4 10*3/uL (ref 4.0–10.5)

## 2021-03-17 LAB — LIPID PANEL
Cholesterol: 143 mg/dL (ref 0–200)
HDL: 32.5 mg/dL — ABNORMAL LOW (ref >39.00)
NonHDL: 110.28
Total CHOL/HDL Ratio: 4
Triglycerides: 311 mg/dL — ABNORMAL HIGH (ref 0.0–149.0)
VLDL: 62.2 mg/dL — ABNORMAL HIGH (ref 0.0–40.0)

## 2021-03-17 LAB — HEMOGLOBIN A1C: Hgb A1c MFr Bld: 5.6 % (ref 4.6–6.5)

## 2021-03-17 LAB — LDL CHOLESTEROL, DIRECT: Direct LDL: 70 mg/dL

## 2021-03-18 LAB — PSA, TOTAL WITH REFLEX TO PSA, FREE: PSA, Total: 1 ng/mL (ref <=4.0)

## 2021-03-23 ENCOUNTER — Encounter: Payer: Self-pay | Admitting: Family Medicine
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
## 2021-03-24 ENCOUNTER — Other Ambulatory Visit: Payer: Self-pay

## 2021-03-24 ENCOUNTER — Ambulatory Visit (INDEPENDENT_AMBULATORY_CARE_PROVIDER_SITE_OTHER): Payer: 59 | Admitting: Family Medicine

## 2021-03-24 ENCOUNTER — Encounter: Payer: Self-pay | Admitting: Family Medicine

## 2021-03-24 VITALS — BP 104/74 | HR 65 | Temp 98.3°F | Ht 70.5 in | Wt 226.5 lb

## 2021-03-24 DIAGNOSIS — Z0000 Encounter for general adult medical examination without abnormal findings: Secondary | ICD-10-CM | POA: Diagnosis not present

## 2021-03-25 ENCOUNTER — Telehealth: Payer: Self-pay | Admitting: Adult Health

## 2021-03-25 NOTE — Telephone Encounter (Signed)
-----   Message from Dorothyann Peng, NP sent at 03/25/2021  8:11 AM EDT ----- Regarding: RE: Patient transfer Bradly Bienenstock,   Thank you for the referral. We will get him set up for a transfer care appointment   [facility]  ----- Message ----- From: Owens Loffler, MD Sent: 03/24/2021  10:00 AM EDT To: Dorothyann Peng, NP Subject: Patient transfer                               Tommi Rumps,   My long-term patient Linna Hoff lives in [place] now.  He is really a nice guy, and he is not very complex medically.  He wanted to see if you would see him as a Financial controller transfer of care, but your office told him that you do not take new patients.  If you can't accommodate him, could you recommend anyone else that would at [place]?   Otelia Santee

## 2021-03-25 NOTE — Telephone Encounter (Signed)
Patient is scheduled   

## 2021-03-25 NOTE — Telephone Encounter (Signed)
Called the pt to get him scheduled as a TOC with Tommi Rumps

## 2021-04-03 ENCOUNTER — Encounter: Payer: 59 | Admitting: Adult Health

## 2021-04-10 ENCOUNTER — Encounter: Payer: Self-pay | Admitting: Adult Health

## 2021-04-10 ENCOUNTER — Other Ambulatory Visit: Payer: Self-pay

## 2021-04-10 ENCOUNTER — Ambulatory Visit: Payer: 59 | Admitting: Adult Health

## 2021-04-10 VITALS — BP 130/88 | HR 86 | Temp 99.0°F | Ht 70.5 in | Wt 225.0 lb

## 2021-04-10 DIAGNOSIS — Z7689 Persons encountering health services in other specified circumstances: Secondary | ICD-10-CM

## 2021-04-10 DIAGNOSIS — F411 Generalized anxiety disorder: Secondary | ICD-10-CM

## 2021-04-10 DIAGNOSIS — E785 Hyperlipidemia, unspecified: Secondary | ICD-10-CM

## 2021-04-10 DIAGNOSIS — F3341 Major depressive disorder, recurrent, in partial remission: Secondary | ICD-10-CM | POA: Diagnosis not present

## 2021-04-10 DIAGNOSIS — I10 Essential (primary) hypertension: Secondary | ICD-10-CM | POA: Diagnosis not present

## 2021-04-10 DIAGNOSIS — Z23 Encounter for immunization: Secondary | ICD-10-CM | POA: Diagnosis not present

## 2021-04-10 MED ORDER — BUPROPION HCL ER (XL) 150 MG PO TB24: 150.0000 mg | ORAL_TABLET | Freq: Every day | ORAL | 1 refills | Status: DC

## 2021-04-10 MED ORDER — LOSARTAN POTASSIUM 50 MG PO TABS: 1.0000 | ORAL_TABLET | Freq: Every day | ORAL | 3 refills | Status: DC

## 2021-04-10 NOTE — Patient Instructions (Addendum)
It was great meeting you today  Let me know how you do on the Wellbutrin   I will see you back in 2023 for your physical unless you need something sooner
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
## 2021-04-23 ENCOUNTER — Encounter: Payer: Self-pay | Admitting: Adult Health

## 2021-05-26 ENCOUNTER — Encounter: Payer: Self-pay | Admitting: Adult Health

## 2021-05-26 DIAGNOSIS — F3341 Major depressive disorder, recurrent, in partial remission: Secondary | ICD-10-CM

## 2021-05-27 MED ORDER — ROSUVASTATIN CALCIUM 40 MG PO TABS: 40.0000 mg | ORAL_TABLET | Freq: Every day | ORAL | 2 refills | Status: DC

## 2021-05-27 MED ORDER — BUPROPION HCL ER (XL) 150 MG PO TB24: 150.0000 mg | ORAL_TABLET | Freq: Every day | ORAL | 1 refills | Status: DC

## 2021-06-05 ENCOUNTER — Other Ambulatory Visit: Payer: Self-pay | Admitting: Adult Health

## 2021-06-05 DIAGNOSIS — F3341 Major depressive disorder, recurrent, in partial remission: Secondary | ICD-10-CM

## 2021-07-03 ENCOUNTER — Other Ambulatory Visit: Payer: Self-pay | Admitting: Family Medicine

## 2021-08-12 ENCOUNTER — Other Ambulatory Visit: Payer: Self-pay | Admitting: Family Medicine

## 2021-09-01 ENCOUNTER — Other Ambulatory Visit: Payer: Self-pay | Admitting: Adult Health

## 2021-09-01 DIAGNOSIS — F3341 Major depressive disorder, recurrent, in partial remission: Secondary | ICD-10-CM

## 2021-09-06 ENCOUNTER — Other Ambulatory Visit: Payer: Self-pay | Admitting: Family Medicine

## 2021-10-22 ENCOUNTER — Encounter: Payer: Self-pay | Admitting: Adult Health

## 2021-10-22 DIAGNOSIS — F3341 Major depressive disorder, recurrent, in partial remission: Secondary | ICD-10-CM

## 2021-10-27 ENCOUNTER — Other Ambulatory Visit: Payer: Self-pay | Admitting: Adult Health

## 2021-10-27 DIAGNOSIS — F3341 Major depressive disorder, recurrent, in partial remission: Secondary | ICD-10-CM

## 2021-12-19 ENCOUNTER — Encounter: Payer: Self-pay | Admitting: Adult Health

## 2021-12-19 DIAGNOSIS — F3341 Major depressive disorder, recurrent, in partial remission: Secondary | ICD-10-CM

## 2021-12-22 MED ORDER — FLUTICASONE PROPIONATE 50 MCG/ACT NA SUSP: NASAL | 2 refills | Status: DC

## 2021-12-22 MED ORDER — BUPROPION HCL ER (XL) 150 MG PO TB24: 150.0000 mg | ORAL_TABLET | Freq: Every day | ORAL | 1 refills | Status: DC

## 2021-12-22 MED ORDER — FLUOXETINE HCL 40 MG PO CAPS: ORAL_CAPSULE | ORAL | 1 refills | Status: DC

## 2021-12-22 MED ORDER — AMLODIPINE BESYLATE 10 MG PO TABS: 10.0000 mg | ORAL_TABLET | Freq: Every day | ORAL | 0 refills | Status: DC

## 2022-01-30 ENCOUNTER — Encounter: Payer: Self-pay | Admitting: Adult Health

## 2022-01-30 NOTE — Telephone Encounter (Signed)
Please advise 

## 2022-02-26 ENCOUNTER — Other Ambulatory Visit: Payer: Self-pay | Admitting: Adult Health

## 2022-03-02 ENCOUNTER — Encounter: Payer: Self-pay | Admitting: Adult Health

## 2022-03-05 ENCOUNTER — Other Ambulatory Visit: Payer: Self-pay | Admitting: Adult Health

## 2022-03-25 ENCOUNTER — Encounter: Payer: Self-pay | Admitting: Adult Health

## 2022-03-25 ENCOUNTER — Ambulatory Visit (INDEPENDENT_AMBULATORY_CARE_PROVIDER_SITE_OTHER): Payer: 59 | Admitting: Adult Health

## 2022-03-25 VITALS — BP 130/88 | HR 75 | Temp 98.2°F | Ht 70.0 in | Wt 234.0 lb

## 2022-03-25 DIAGNOSIS — I10 Essential (primary) hypertension: Secondary | ICD-10-CM | POA: Diagnosis not present

## 2022-03-25 DIAGNOSIS — N529 Male erectile dysfunction, unspecified: Secondary | ICD-10-CM

## 2022-03-25 DIAGNOSIS — N401 Enlarged prostate with lower urinary tract symptoms: Secondary | ICD-10-CM | POA: Diagnosis not present

## 2022-03-25 DIAGNOSIS — F411 Generalized anxiety disorder: Secondary | ICD-10-CM

## 2022-03-25 DIAGNOSIS — E785 Hyperlipidemia, unspecified: Secondary | ICD-10-CM | POA: Diagnosis not present

## 2022-03-25 DIAGNOSIS — F3341 Major depressive disorder, recurrent, in partial remission: Secondary | ICD-10-CM

## 2022-03-25 DIAGNOSIS — Z0000 Encounter for general adult medical examination without abnormal findings: Secondary | ICD-10-CM | POA: Diagnosis not present

## 2022-03-25 DIAGNOSIS — R351 Nocturia: Secondary | ICD-10-CM

## 2022-03-25 LAB — CBC WITH DIFFERENTIAL/PLATELET
Basophils Absolute: 0 10*3/uL (ref 0.0–0.1)
Basophils Relative: 0.7 % (ref 0.0–3.0)
Eosinophils Absolute: 0.2 10*3/uL (ref 0.0–0.7)
Eosinophils Relative: 2.3 % (ref 0.0–5.0)
HCT: 47 % (ref 39.0–52.0)
Hemoglobin: 16 g/dL (ref 13.0–17.0)
Lymphocytes Relative: 25.7 % (ref 12.0–46.0)
Lymphs Abs: 1.9 10*3/uL (ref 0.7–4.0)
MCHC: 34 g/dL (ref 30.0–36.0)
MCV: 82 fl (ref 78.0–100.0)
Monocytes Absolute: 0.9 10*3/uL (ref 0.1–1.0)
Monocytes Relative: 11.6 % (ref 3.0–12.0)
Neutro Abs: 4.5 10*3/uL (ref 1.4–7.7)
Neutrophils Relative %: 59.7 % (ref 43.0–77.0)
Platelets: 188 10*3/uL (ref 150.0–400.0)
RBC: 5.73 Mil/uL (ref 4.22–5.81)
RDW: 14.4 % (ref 11.5–15.5)
WBC: 7.5 10*3/uL (ref 4.0–10.5)

## 2022-03-25 LAB — URINALYSIS
Bilirubin Urine: NEGATIVE
Hgb urine dipstick: NEGATIVE
Ketones, ur: NEGATIVE
Leukocytes,Ua: NEGATIVE
Nitrite: NEGATIVE
Specific Gravity, Urine: 1.015 (ref 1.000–1.030)
Total Protein, Urine: NEGATIVE
Urine Glucose: NEGATIVE
Urobilinogen, UA: 0.2 (ref 0.0–1.0)
pH: 6 (ref 5.0–8.0)

## 2022-03-25 LAB — COMPREHENSIVE METABOLIC PANEL
ALT: 43 U/L (ref 0–53)
AST: 32 U/L (ref 0–37)
Albumin: 4.6 g/dL (ref 3.5–5.2)
Alkaline Phosphatase: 73 U/L (ref 39–117)
BUN: 11 mg/dL (ref 6–23)
CO2: 24 mEq/L (ref 19–32)
Calcium: 9 mg/dL (ref 8.4–10.5)
Chloride: 101 mEq/L (ref 96–112)
Creatinine, Ser: 0.82 mg/dL (ref 0.40–1.50)
GFR: 97.26 mL/min (ref >60.00)
Glucose, Bld: 91 mg/dL (ref 70–99)
Potassium: 3.8 mEq/L (ref 3.5–5.1)
Sodium: 137 mEq/L (ref 135–145)
Total Bilirubin: 0.6 mg/dL (ref 0.2–1.2)
Total Protein: 7.4 g/dL (ref 6.0–8.3)

## 2022-03-25 LAB — LIPID PANEL
Cholesterol: 127 mg/dL (ref 0–200)
HDL: 36.4 mg/dL — ABNORMAL LOW (ref >39.00)
NonHDL: 90.47
Total CHOL/HDL Ratio: 3
Triglycerides: 266 mg/dL — ABNORMAL HIGH (ref 0.0–149.0)
VLDL: 53.2 mg/dL — ABNORMAL HIGH (ref 0.0–40.0)

## 2022-03-25 LAB — PSA: PSA: 1.44 ng/mL (ref 0.10–4.00)

## 2022-03-25 LAB — TSH: TSH: 2.4 u[IU]/mL (ref 0.35–5.50)

## 2022-03-25 LAB — HEMOGLOBIN A1C: Hgb A1c MFr Bld: 5.6 % (ref 4.6–6.5)

## 2022-03-25 LAB — LDL CHOLESTEROL, DIRECT: Direct LDL: 62 mg/dL

## 2022-03-25 MED ORDER — BUPROPION HCL ER (XL) 150 MG PO TB24: 150.0000 mg | ORAL_TABLET | Freq: Every day | ORAL | 1 refills | Status: DC

## 2022-03-25 MED ORDER — LOSARTAN POTASSIUM 50 MG PO TABS: 50.0000 mg | ORAL_TABLET | Freq: Every day | ORAL | 3 refills | Status: DC

## 2022-03-25 MED ORDER — SILDENAFIL CITRATE 100 MG PO TABS: 100.0000 mg | ORAL_TABLET | ORAL | 3 refills | Status: DC | PRN

## 2022-03-25 MED ORDER — AMLODIPINE BESYLATE 10 MG PO TABS: 10.0000 mg | ORAL_TABLET | Freq: Every day | ORAL | 3 refills | Status: DC
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
## 2022-04-21 ENCOUNTER — Ambulatory Visit (INDEPENDENT_AMBULATORY_CARE_PROVIDER_SITE_OTHER): Payer: 59

## 2022-04-21 ENCOUNTER — Ambulatory Visit: Payer: 59 | Admitting: Adult Health

## 2022-04-21 ENCOUNTER — Encounter: Payer: Self-pay | Admitting: Adult Health

## 2022-04-21 VITALS — BP 128/86 | HR 72 | Temp 99.0°F | Ht 70.0 in | Wt 235.0 lb

## 2022-04-21 DIAGNOSIS — M25561 Pain in right knee: Secondary | ICD-10-CM

## 2022-04-21 DIAGNOSIS — M79672 Pain in left foot: Secondary | ICD-10-CM
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
## 2022-04-22 ENCOUNTER — Encounter: Payer: Self-pay | Admitting: Adult Health

## 2022-05-14 ENCOUNTER — Ambulatory Visit
Admission: RE | Admit: 2022-05-14 | Discharge: 2022-05-14 | Disposition: A | Discharge status: Home / Self Care | Payer: Self-pay | Source: Ambulatory Visit | Attending: Adult Health | Admitting: Adult Health

## 2022-05-14 DIAGNOSIS — E785 Hyperlipidemia, unspecified: Secondary | ICD-10-CM

## 2022-05-15 ENCOUNTER — Other Ambulatory Visit: Payer: Self-pay

## 2022-05-15 DIAGNOSIS — R931 Abnormal findings on diagnostic imaging of heart and coronary circulation: Secondary | ICD-10-CM
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
## 2022-05-22 ENCOUNTER — Ambulatory Visit: Payer: 59 | Admitting: Interventional Cardiology

## 2022-05-22 ENCOUNTER — Encounter: Payer: Self-pay | Admitting: Interventional Cardiology

## 2022-05-22 VITALS — BP 122/92 | HR 75 | Ht 70.0 in | Wt 231.8 lb

## 2022-05-22 DIAGNOSIS — Z87891 Personal history of nicotine dependence: Secondary | ICD-10-CM

## 2022-05-22 DIAGNOSIS — E785 Hyperlipidemia, unspecified: Secondary | ICD-10-CM | POA: Diagnosis not present

## 2022-05-22 DIAGNOSIS — I10 Essential (primary) hypertension: Secondary | ICD-10-CM

## 2022-05-22 DIAGNOSIS — K760 Fatty (change of) liver, not elsewhere classified: Secondary | ICD-10-CM

## 2022-05-22 DIAGNOSIS — N529 Male erectile dysfunction, unspecified: Secondary | ICD-10-CM | POA: Diagnosis not present

## 2022-05-22 DIAGNOSIS — R931 Abnormal findings on diagnostic imaging of heart and coronary circulation: Secondary | ICD-10-CM

## 2022-05-22 DIAGNOSIS — R0609 Other forms of dyspnea: Secondary | ICD-10-CM

## 2022-05-22 MED ORDER — ICOSAPENT ETHYL 1 G PO CAPS: 2.0000 g | ORAL_CAPSULE | Freq: Two times a day (BID) | ORAL | 11 refills | Status: DC

## 2022-05-22 NOTE — Patient Instructions (Signed)
Medication Instructions:  Your physician has recommended you make the following change in your medication:   1) START Icosapent Ethyl (Vascepa) 2g twice daily  *If you need a refill on your cardiac medications before your next appointment, please call your pharmacy*  Lab Work: In 3 month: fasting Lipid panel If you have labs (blood work) drawn today and your tests are completely normal, you will receive your results only by: [place] (if you have MyChart) OR A paper copy in the mail If you have any lab test that is abnormal or we need to change your treatment, we will call you to review the results.  Testing/Procedures: Your physician has requested that you have a lexiscan myoview. For further information please visit [email or web address]. Please follow instruction sheet, as given.   Follow-Up: At [facility], you and your health needs are our priority.  As part of our continuing mission to provide you with exceptional heart care, we have created designated Provider Care Teams.  These Care Teams include your primary Cardiologist (physician) and Advanced Practice Providers (APPs -  Physician Assistants and Nurse Practitioners) who all work together to provide you with the care you need, when you need it.  Your next appointment:   1 year(s)  The format for your next appointment:   In Person  Provider:   Sinclair Grooms, MD {   Important Information About Sugar

## 2022-05-27 ENCOUNTER — Telehealth (HOSPITAL_COMMUNITY): Payer: Self-pay | Admitting: *Deleted

## 2022-05-27 ENCOUNTER — Encounter (HOSPITAL_COMMUNITY): Payer: Self-pay | Admitting: *Deleted

## 2022-05-27 NOTE — Telephone Encounter (Signed)
Attempted to call patient regarding upcoming appointment- no answer- unable to leave a message. Letter sent with instructions to my chart. Kirstie Peri

## 2022-06-03 ENCOUNTER — Ambulatory Visit (HOSPITAL_COMMUNITY): Payer: 59 | Attending: Cardiology

## 2022-06-03 ENCOUNTER — Encounter (HOSPITAL_COMMUNITY): Payer: 59

## 2022-06-03 DIAGNOSIS — R0609 Other forms of dyspnea: Secondary | ICD-10-CM | POA: Diagnosis present

## 2022-06-03 LAB — MYOCARDIAL PERFUSION IMAGING
LV dias vol: 124 mL (ref 62–150)
LV sys vol: 68 mL
Nuc Stress EF: 45 %
Peak HR: 76 {beats}/min
Rest HR: 76 {beats}/min
Rest Nuclear Isotope Dose: 11 mCi
SDS: 0
SRS: 0
SSS: 0
ST Depression (mm): 0 mm
Stress Nuclear Isotope Dose: 30.8 mCi
TID: 1

## 2022-06-03 MED ORDER — TECHNETIUM TC 99M TETROFOSMIN IV KIT
30.8000 | PACK | Freq: Once | INTRAVENOUS | Status: AC | PRN
  Administered 2022-06-03: 30.8 via INTRAVENOUS

## 2022-06-03 MED ORDER — REGADENOSON 0.4 MG/5ML IV SOLN
0.4000 mg | Freq: Once | INTRAVENOUS | Status: AC
  Administered 2022-06-03: 0.4 mg via INTRAVENOUS

## 2022-06-03 MED ORDER — TECHNETIUM TC 99M TETROFOSMIN IV KIT
11.0000 | PACK | Freq: Once | INTRAVENOUS | Status: AC | PRN
  Administered 2022-06-03: 11 via INTRAVENOUS

## 2022-06-05 ENCOUNTER — Other Ambulatory Visit: Payer: Self-pay | Admitting: Interventional Cardiology

## 2022-06-05 ENCOUNTER — Encounter (HOSPITAL_COMMUNITY): Payer: Self-pay

## 2022-06-05 DIAGNOSIS — R0609 Other forms of dyspnea: Secondary | ICD-10-CM

## 2022-06-17 ENCOUNTER — Ambulatory Visit (HOSPITAL_COMMUNITY): Payer: 59 | Attending: Interventional Cardiology

## 2022-06-17 DIAGNOSIS — R0609 Other forms of dyspnea: Secondary | ICD-10-CM

## 2022-06-17 LAB — ECHOCARDIOGRAM COMPLETE
Area-P 1/2: 3.17 cm2
S' Lateral: 3.4 cm

## 2022-06-17 MED ORDER — PERFLUTREN LIPID MICROSPHERE
1.0000 mL | INTRAVENOUS | Status: AC | PRN
  Administered 2022-06-17: 3 mL via INTRAVENOUS

## 2022-06-30 NOTE — Progress Notes (Unsigned)
Cardiology Office Note:    Date:  07/01/2022   ID:  Windell Hummingbird, DOB [birth year], MRN [id number]  PCP:  Dorothyann Peng, NP  Cardiologist:  Sinclair Grooms, MD   Referring MD: Dorothyann Peng, NP   Chief Complaint  Patient presents with   Coronary Artery Disease   Hypertension   Hyperlipidemia    History of Present Illness:    Paul Vazquez is a 56 y.o. male with a hx of elevated coronary calcium score, hyperlipidemia (rosuvastatin), primary hypertension, ED (viagra), former smoker, fatty steatosis 2019 on CT, and emphysema.    He is doing well.  No cardiac symptoms.  Denies orthopnea, PND, syncope.  Preventive therapy, exercise, and change in diet have already been instituted.  Blood work will be done later this year.  Past Medical History:  Diagnosis Date   Anxiety    Hx of adenomatous colonic polyps    Hyperlipidemia     Past Surgical History:  Procedure Laterality Date   EYE SURGERY     56 yo old-removed bb from eye    GYNECOMASTIA EXCISION     POLYPECTOMY      Current Medications: Current Meds  Medication Sig   amLODipine (NORVASC) 10 MG tablet Take 1 tablet (10 mg total) by mouth daily.   BABY ASPIRIN PO Take 81 mg by mouth daily in the afternoon.   buPROPion (WELLBUTRIN XL) 150 MG 24 hr tablet Take 1 tablet (150 mg total) by mouth daily.   FLUoxetine (PROZAC) 40 MG capsule TAKE 1 CAPSULE ALTERNATING WITH 2 CAPSULES EVERY OTHERDAY   icosapent Ethyl (VASCEPA) 1 g capsule Take 2 capsules (2 g total) by mouth 2 (two) times daily.   losartan (COZAAR) 50 MG tablet Take 1 tablet (50 mg total) by mouth daily.   Multiple Vitamin (MULTIVITAMIN) tablet Take 1 tablet by mouth daily.   rosuvastatin (CRESTOR) 40 MG tablet TAKE 1 TABLET DAILY   sildenafil (VIAGRA) 100 MG tablet Take 1 tablet (100 mg total) by mouth as needed for erectile dysfunction.     Allergies:   Patient has no known allergies.   Social History   Socioeconomic History   Marital status:  Married    Spouse name: Not on file   Number of children: Not on file   Years of education: Not on file   Highest education level: Bachelor's degree (e.g., BA, AB, BS)  Occupational History   Occupation: auto damage appraiser  Tobacco Use   Smoking status: Former    Packs/day: 1.00    Types: Cigarettes    Quit date: 11/17/2015    Years since quitting: 6.6   Smokeless tobacco: Never  Vaping Use   Vaping Use: Every day  Substance and Sexual Activity   Alcohol use: No    Alcohol/week: 0.0 standard drinks of alcohol   Drug use: No   Sexual activity: Not on file  Other Topics Concern   Not on file  Social History Narrative   Partner, [name], of 32 years   Social Determinants of Health   Financial Resource Strain: Low Risk  (04/17/2022)   Overall Financial Resource Strain (CARDIA)    Difficulty of Paying Living Expenses: Not hard at all  Food Insecurity: No Food Insecurity (04/17/2022)   Hunger Vital Sign    Worried About Running Out of Food in the Last Year: Never true    Ran Out of Food in the Last Year: Never true  Transportation Needs: No Transportation Needs (04/17/2022)  PRAPARE - Hydrologist (Medical): No    Lack of Transportation (Non-Medical): No  Physical Activity: Unknown (04/17/2022)   Exercise Vital Sign    Days of Exercise per Week: 0 days    Minutes of Exercise per Session: Not on file  Stress: Stress Concern Present (04/17/2022)   [place]    Feeling of Stress : To some extent  Social Connections: Moderately Isolated (04/17/2022)   Social Connection and Isolation Panel [NHANES]    Frequency of Communication with Friends and Family: More than three times a week    Frequency of Social Gatherings with Friends and Family: Three times a week    Attends Religious Services: Never    Active Member of Clubs or Organizations: No    Attends Music therapist: Not on file     Marital Status: Married     Family History: The patient's family history is negative for Colon cancer, Esophageal cancer, Rectal cancer, Stomach cancer, and Colon polyps.  ROS:   Please see the history of present illness.    No medication side effects.  We reviewed data obtained during the cardiac evaluation.  All other systems reviewed and are negative.  EKGs/Labs/Other Studies Reviewed:    The following studies were reviewed today:  Coronary calcium score 05/14/2022: IMPRESSION: Coronary calcium score of 330. This was 103 percentile for age-, race-, and sex-matched controls.    STRESS NUCLEAR  06/03/2022: Study Highlights      Findings are consistent with a small area of ischemia in the basal inferior wall. The study is intermediate risk due depressed ejection fraction. Frequent Premature ventricular complexes noted during recovery.   Left ventricular function is abnormal. Global function is mildly reduced. Nuclear stress EF: 45 %. The left ventricular ejection fraction is mildly decreased (45-54%). End diastolic cavity size is mildly enlarged.   No ST deviation was noted.  ECHOCARDIOGRAM 06/17/2022: IMPRESSIONS     1. Left ventricular ejection fraction, by estimation, is 55 to 60%. The  left ventricle has normal function. The left ventricle has no regional  wall motion abnormalities. Left ventricular diastolic parameters were  normal.   2. Right ventricular systolic function is normal. The right ventricular  size is normal.   3. The mitral valve is normal in structure. Trivial mitral valve  regurgitation.   4. The aortic valve is tricuspid. Aortic valve regurgitation is not  visualized. Aortic valve sclerosis is present, with no evidence of aortic  valve stenosis.   EKG:  EKG EKG is not repeated  Recent Labs: 03/25/2022: ALT 43; BUN 11; Creatinine, Ser 0.82; Hemoglobin 16.0; Platelets 188.0; Potassium 3.8; Sodium 137; TSH 2.40  Recent Lipid Panel    Component Value  Date/Time   CHOL 127 03/25/2022 0956   TRIG 266.0 (H) 03/25/2022 0956   HDL 36.40 (L) 03/25/2022 0956   CHOLHDL 3 03/25/2022 0956   VLDL 53.2 (H) 03/25/2022 0956   LDLDIRECT 62.0 03/25/2022 0956    Physical Exam:    VS:  BP 114/66   Pulse 72   Ht '5\' 10"'$  (1.778 m)   Wt 236 lb 3.2 oz (107.1 kg)   SpO2 96%   BMI 33.89 kg/m     Wt Readings from Last 3 Encounters:  07/01/22 236 lb 3.2 oz (107.1 kg)  06/03/22 231 lb (104.8 kg)  05/22/22 231 lb 12.8 oz (105.1 kg)    Repeat blood pressure 132/82 mmHg GEN: Moderate obesity.  No acute distress HEENT: Normal NECK: No JVD. LYMPHATICS: No lymphadenopathy CARDIAC: No murmur. RRR no gallop, or edema. VASCULAR:  Normal Pulses. No bruits. RESPIRATORY:  Clear to auscultation without rales, wheezing or rhonchi  ABDOMEN: Soft, non-tender, non-distended, No pulsatile mass, MUSCULOSKELETAL: No deformity  SKIN: Warm and dry NEUROLOGIC:  Alert and oriented x 3 PSYCHIATRIC:  Normal affect   ASSESSMENT:    1. Elevated coronary artery calcium score   2. DOE (dyspnea on exertion)   3. Essential hypertension   4. ERECTILE DYSFUNCTION, ORGANIC   5. Hyperlipidemia LDL goal <70   6. Former smoker    PLAN:    In order of problems listed above:  Moderately increased risk with coronary calcium score greater than 300.  Secondary prevention discussed. Improving with exercise. Controlled. Did not discuss. Treating both LDL and triglycerides. No longer smoking.  Assign a new cardiologist and have clinical follow-up in 1 year for coaching and risk factor control.  We will have laboratory data done in November.  We will follow-up over the phone.  He requests Dr. Marlou Porch is his cardiologist going forward.  Follow-up with Dr. Marlou Porch in 1 year.    Medication Adjustments/Labs and Tests Ordered: Current medicines are reviewed at length with the patient today.  Concerns regarding medicines are outlined above.  No orders of the defined types were  placed in this encounter.  No orders of the defined types were placed in this encounter.   Patient Instructions  Medication Instructions:  Your physician recommends that you continue on your current medications as directed. Please refer to the Current Medication list given to you today.  *If you need a refill on your cardiac medications before your next appointment, please call your pharmacy*  Lab Work: NONE  Testing/Procedures: NONE  Follow-Up: At [facility], you and your health needs are our priority.  As part of our continuing mission to provide you with exceptional heart care, we have created designated Provider Care Teams.  These Care Teams include your primary Cardiologist (physician) and Advanced Practice Providers (APPs -  Physician Assistants and Nurse Practitioners) who all work together to provide you with the care you need, when you need it.  Your next appointment:   1 year(s)  The format for your next appointment:   In Person  Provider:   Candee Furbish, MD  Important Information About Sugar         Signed, Sinclair Grooms, MD  07/01/2022 10:59 AM    [place]

## 2022-07-01 ENCOUNTER — Encounter: Payer: Self-pay | Admitting: Interventional Cardiology

## 2022-07-01 ENCOUNTER — Ambulatory Visit (INDEPENDENT_AMBULATORY_CARE_PROVIDER_SITE_OTHER): Payer: 59 | Admitting: Interventional Cardiology

## 2022-07-01 VITALS — BP 114/66 | HR 72 | Ht 70.0 in | Wt 236.2 lb

## 2022-07-01 DIAGNOSIS — R0609 Other forms of dyspnea: Secondary | ICD-10-CM

## 2022-07-01 DIAGNOSIS — N529 Male erectile dysfunction, unspecified: Secondary | ICD-10-CM

## 2022-07-01 DIAGNOSIS — Z87891 Personal history of nicotine dependence: Secondary | ICD-10-CM

## 2022-07-01 DIAGNOSIS — E785 Hyperlipidemia, unspecified: Secondary | ICD-10-CM

## 2022-07-01 DIAGNOSIS — I10 Essential (primary) hypertension: Secondary | ICD-10-CM | POA: Diagnosis not present

## 2022-07-01 DIAGNOSIS — R931 Abnormal findings on diagnostic imaging of heart and coronary circulation: Secondary | ICD-10-CM

## 2022-07-01 NOTE — Patient Instructions (Signed)
Medication Instructions:  Your physician recommends that you continue on your current medications as directed. Please refer to the Current Medication list given to you today.  *If you need a refill on your cardiac medications before your next appointment, please call your pharmacy*  Lab Work: NONE  Testing/Procedures: NONE  Follow-Up: At [facility], you and your health needs are our priority.  As part of our continuing mission to provide you with exceptional heart care, we have created designated Provider Care Teams.  These Care Teams include your primary Cardiologist (physician) and Advanced Practice Providers (APPs -  Physician Assistants and Nurse Practitioners) who all work together to provide you with the care you need, when you need it.  Your next appointment:   1 year(s)  The format for your next appointment:   In Person  Provider:   Mark Skains, MD {   Important Information About Sugar       

## 2022-07-11 ENCOUNTER — Emergency Department (HOSPITAL_BASED_OUTPATIENT_CLINIC_OR_DEPARTMENT_OTHER)
Admission: EM | Admit: 2022-07-11 | Discharge: 2022-07-11 | Disposition: A | Discharge status: Home / Self Care | Payer: 59 | Attending: Emergency Medicine | Admitting: Emergency Medicine

## 2022-07-11 ENCOUNTER — Encounter (HOSPITAL_BASED_OUTPATIENT_CLINIC_OR_DEPARTMENT_OTHER): Payer: Self-pay

## 2022-07-11 ENCOUNTER — Other Ambulatory Visit: Payer: Self-pay

## 2022-07-11 DIAGNOSIS — Z79899 Other long term (current) drug therapy: Secondary | ICD-10-CM | POA: Insufficient documentation

## 2022-07-11 DIAGNOSIS — M542 Cervicalgia: Secondary | ICD-10-CM | POA: Diagnosis present

## 2022-07-11 DIAGNOSIS — Z87891 Personal history of nicotine dependence: Secondary | ICD-10-CM | POA: Diagnosis not present

## 2022-07-11 DIAGNOSIS — I10 Essential (primary) hypertension: Secondary | ICD-10-CM | POA: Diagnosis not present

## 2022-07-11 DIAGNOSIS — M5412 Radiculopathy, cervical region: Secondary | ICD-10-CM | POA: Insufficient documentation

## 2022-07-11 DIAGNOSIS — M5410 Radiculopathy, site unspecified: Secondary | ICD-10-CM

## 2022-07-11 LAB — URINALYSIS, ROUTINE W REFLEX MICROSCOPIC
Bilirubin Urine: NEGATIVE
Glucose, UA: NEGATIVE mg/dL
Hgb urine dipstick: NEGATIVE
Ketones, ur: NEGATIVE mg/dL
Leukocytes,Ua: NEGATIVE
Nitrite: NEGATIVE
Specific Gravity, Urine: 1.019 (ref 1.005–1.030)
pH: 5.5 (ref 5.0–8.0)

## 2022-07-11 MED ORDER — CYCLOBENZAPRINE HCL 10 MG PO TABS: 10.0000 mg | ORAL_TABLET | Freq: Two times a day (BID) | ORAL | 0 refills | Status: DC | PRN

## 2022-07-11 MED ORDER — LIDOCAINE 5 % EX PTCH
1.0000 | MEDICATED_PATCH | CUTANEOUS | Status: DC
  Administered 2022-07-11: 1 via TRANSDERMAL
  Filled 2022-07-11: qty 1

## 2022-07-11 MED ORDER — ACETAMINOPHEN 500 MG PO TABS
1000.0000 mg | ORAL_TABLET | ORAL | Status: AC
Start: 2022-07-11 — End: 2022-07-11
  Administered 2022-07-11: 1000 mg via ORAL
  Filled 2022-07-11: qty 2

## 2022-07-11 MED ORDER — LIDOCAINE 5 % EX PTCH: 1.0000 | MEDICATED_PATCH | CUTANEOUS | 0 refills | Status: DC

## 2022-07-11 MED ORDER — CYCLOBENZAPRINE HCL 5 MG PO TABS
5.0000 mg | ORAL_TABLET | Freq: Once | ORAL | Status: AC
  Administered 2022-07-11: 5 mg via ORAL
  Filled 2022-07-11: qty 1

## 2022-07-11 MED ORDER — KETOROLAC TROMETHAMINE 15 MG/ML IJ SOLN
15.0000 mg | Freq: Once | INTRAMUSCULAR | Status: AC
  Administered 2022-07-11: 15 mg via INTRAVENOUS
  Filled 2022-07-11: qty 1

## 2022-07-11 NOTE — ED Provider Notes (Signed)
[place] EMERGENCY DEPT Provider Note   CSN: [id number] Arrival date & time: 07/11/22  5329     History  Chief Complaint  Patient presents with   stiff neck         Paul Vazquez is a 56 y.o. male.  56 year old male with a history of lower back degenerative disc disease, hypertension, hyperlipidemia, and former smoker who presents to the emergency department with neck pain.  Patient states that 2 days ago he awoke with severe neck pain.  Says that it is a sharp stabbing pain.  Does have dull radiation down his left arm.  Says that today he started noticing radiation down his right arm as well.  Thinks that over the past several days may have had some urinary retention but is not sure if that is just due to the pain that he is having.  Denies any dysuria or frequency.  No fevers.  No history of IVDU.  No history of neck injuries.  No history of cervical disc disease.  Denies any bowel or bladder incontinence or changes in sensation when he wipes.  No numbness or weakness of his arms or legs.  Has tried methocarbamol, tramadol, and meloxicam at home without improvement of his symptoms.    Past Medical History:  Diagnosis Date   Anxiety    Hx of adenomatous colonic polyps    Hyperlipidemia        Home Medications Prior to Admission medications   Medication Sig Start Date End Date Taking? Authorizing Provider  cyclobenzaprine (FLEXERIL) 10 MG tablet Take 1 tablet (10 mg total) by mouth 2 (two) times daily as needed for muscle spasms. 07/11/22  Yes Fransico Meadow, MD  lidocaine (LIDODERM) 5 % Place 1 patch onto the skin daily. Remove & Discard patch within 12 hours or as directed by MD 07/11/22  Yes Fransico Meadow, MD  amLODipine (NORVASC) 10 MG tablet Take 1 tablet (10 mg total) by mouth daily. 03/25/22   Nafziger, Tommi Rumps, NP  BABY ASPIRIN PO Take 81 mg by mouth daily in the afternoon. 05/17/22   [provider]  buPROPion (WELLBUTRIN XL) 150 MG 24 hr  tablet Take 1 tablet (150 mg total) by mouth daily. 03/25/22   Nafziger, Tommi Rumps, NP  FLUoxetine (PROZAC) 40 MG capsule TAKE 1 CAPSULE ALTERNATING WITH 2 CAPSULES EVERY OTHERDAY 03/05/22   Nafziger, Tommi Rumps, NP  icosapent Ethyl (VASCEPA) 1 g capsule Take 2 capsules (2 g total) by mouth 2 (two) times daily. 05/22/22   Belva Crome, MD  losartan (COZAAR) 50 MG tablet Take 1 tablet (50 mg total) by mouth daily. 03/25/22   Nafziger, Tommi Rumps, NP  Multiple Vitamin (MULTIVITAMIN) tablet Take 1 tablet by mouth daily.    [provider]  rosuvastatin (CRESTOR) 40 MG tablet TAKE 1 TABLET DAILY 02/27/22   Nafziger, Tommi Rumps, NP  sildenafil (VIAGRA) 100 MG tablet Take 1 tablet (100 mg total) by mouth as needed for erectile dysfunction. 03/25/22   Dorothyann Peng, NP      Allergies    Patient has no known allergies.    Review of Systems   Review of Systems  Physical Exam Updated Vital Signs BP 126/74   Pulse 70   Temp 98.1 F (36.7 C) (Oral)   Resp 16   SpO2 94%  Physical Exam Vitals and nursing note reviewed.  Constitutional:      General: He is not in acute distress.    Appearance: He is well-developed.  HENT:  Head: Normocephalic and atraumatic.  Eyes:     Conjunctiva/sclera: Conjunctivae normal.  Neck:     Comments: Pain on ranging neck.  Right paraspinal tenderness to palpation at the base of the neck and T1-T2. Cardiovascular:     Rate and Rhythm: Normal rate and regular rhythm.     Heart sounds: No murmur heard. Pulmonary:     Effort: Pulmonary effort is normal. No respiratory distress.  Musculoskeletal:        General: No swelling.  Skin:    General: Skin is warm and dry.     Capillary Refill: Capillary refill takes less than 2 seconds.  Neurological:     Mental Status: He is alert.     Comments: No spinal midline TTP in cervical, thoracic, or lumbar spine. No stepoffs noted.   Symmetrically palpable radial and ulnar pulses. Capillary refill <2 seconds to all digits.  Intact  motor function of the radial, median and ulnar nerves demonstrated by strength of hand grip, and spreading of the 2nd through 5th digits, thumb apposition, and ability to make "OK sign".  Muscle bulk and tone are normal. Strength is 5/5 in hip flexion, knee flexion and extension, ankle dorsiflexion and plantar flexion bilaterally. Full strength of great toe dorsiflexion bilaterally.  Intact sensation to light touch of the radial, median and ulnar nerves demonstrated by testing in the dorsal web space of the thumb, the hypothenar eminence of the palm, and the radial aspect of the dorsum of the hand.  Intact sensation to light touch in L2 though S1 dermatomes bilaterally.   Psychiatric:        Mood and Affect: Mood normal.     ED Results / Procedures / Treatments   Labs (all labs ordered are listed, but only abnormal results are displayed) Labs Reviewed  URINALYSIS, ROUTINE W REFLEX MICROSCOPIC - Abnormal; Notable for the following components:      Result Value   Protein, ur TRACE (*)    All other components within normal limits    EKG None  Radiology No results found.  Procedures Procedures   Medications Ordered in ED Medications  cyclobenzaprine (FLEXERIL) tablet 5 mg (5 mg Oral Given 07/11/22 0934)  acetaminophen (TYLENOL) tablet 1,000 mg (1,000 mg Oral Given 07/11/22 0934)  ketorolac (TORADOL) 15 MG/ML injection 15 mg (15 mg Intravenous Given 07/11/22 1117)    ED Course/ Medical Decision Making/ A&P Clinical Course as of 07/11/22 1739  Sat Jul 11, 2022  1054 Postvoid residual 94. [RP]  1132 Patient's pain much improved. [RP]    Clinical Course User Index [RP] Fransico Meadow, MD                           Medical Decision Making Amount and/or Complexity of Data Reviewed Labs: ordered.  Risk OTC drugs. Prescription drug management.   56 year old male with a history of lower back degenerative disc disease, hypertension, hyperlipidemia, and former smoker who  presents to the emergency department with neck pain.    Initial Ddx:  Cervical radiculopathy, muscular strain, spinal cord compression, spinal epidural abscess/hematoma, UTI  MDM:  The patient likely has cervical/upper thoracic radiculopathy given his constellation of symptoms.  No symptoms of cord compression aside from possible urinary hesitancy which he is unsure of.  Patient also could have had a muscular strain that he sustained while sleeping.  No risk factors for spinal epidural abscess or hematoma noted consider these diagnoses  Plan:  Tylenol  Lidocaine patch Cyclobenzaprine Toradol Urinalysis  ED Summary:  Patient underwent the above work-up and was feeling much better.  Did perform postvoid residual given his symptoms of possible urinary hesitancy but his PVR was WNL.  Also performed a urinalysis which did not show evidence of UTI.  Patient was counseled on the concerning symptoms for back pain including but not limited to saddle anesthesia, urinary retention, bowel or bladder incontinence, or new weakness or numbness of his legs.  Patient was then discharged home with prescription for cyclobenzaprine and was told not to take the methocarbamol with this medication and did not take it before driving.  Dispo: DC Home   Additional history obtained from  friend Records reviewed Care Everywhere  Final Clinical Impression(s) / ED Diagnoses Final diagnoses:  Neck pain  Radiculopathy affecting upper extremity    Rx / DC Orders ED Discharge Orders          Ordered    Ambulatory referral to Neurosurgery        07/11/22 1139    cyclobenzaprine (FLEXERIL) 10 MG tablet  2 times daily PRN        07/11/22 1139    lidocaine (LIDODERM) 5 %  Every 24 hours        07/11/22 1142              Fransico Meadow, MD 07/11/22 1740

## 2022-07-11 NOTE — ED Notes (Addendum)
Highest reading for the bladder scan was 87m post void

## 2022-07-11 NOTE — Discharge Instructions (Signed)
Today you were seen in the emergency department for your neck and arm pain.    In the emergency department you had a neurologic evaluation that was reassuring and were given medications for pain.    At home, please take Tylenol and meloxicam as needed for the pain.  Please also use the cyclobenzaprine as needed for any breakthrough pain but do not take methocarbamol along with this medication and do not take it before driving or operating heavy machinery.    Follow-up with your primary doctor in 2-3 days regarding your visit and possible MRI.  The neurosurgeons will be calling you about setting up an appointment.  You may also use the number included on this packet to set up appointment as well.  Return immediately to the emergency department if you experience any of the following: Numbness or weakness of your arms or legs, bowel or bladder incontinence, or any other concerning symptoms.    Thank you for visiting our Emergency Department. It was a pleasure taking care of you today.

## 2022-07-11 NOTE — ED Triage Notes (Signed)
Reports  non-traumatic neck pain since this Thurs. Which is unrelenting. His position of comfort is marked flexion of neck in midline position. He denies any paresthesias/dysthesias of any extremities. He also states that whenever he attempts to raise his arms, he experiences a "flash of pain" at base-of-neck area.

## 2022-07-17 ENCOUNTER — Ambulatory Visit: Payer: 59 | Admitting: Adult Health

## 2022-07-17 ENCOUNTER — Ambulatory Visit (INDEPENDENT_AMBULATORY_CARE_PROVIDER_SITE_OTHER): Payer: 59

## 2022-07-17 VITALS — BP 110/80 | HR 80 | Temp 98.9°F | Ht 70.0 in | Wt 234.0 lb

## 2022-07-17 DIAGNOSIS — R509 Fever, unspecified: Secondary | ICD-10-CM | POA: Diagnosis not present

## 2022-07-17 DIAGNOSIS — M542 Cervicalgia: Secondary | ICD-10-CM

## 2022-07-17 LAB — CBC WITH DIFFERENTIAL/PLATELET
Basophils Absolute: 0.1 10*3/uL (ref 0.0–0.1)
Basophils Relative: 0.7 % (ref 0.0–3.0)
Eosinophils Absolute: 0.2 10*3/uL (ref 0.0–0.7)
Eosinophils Relative: 2.9 % (ref 0.0–5.0)
HCT: 43.4 % (ref 39.0–52.0)
Hemoglobin: 15 g/dL (ref 13.0–17.0)
Lymphocytes Relative: 21.3 % (ref 12.0–46.0)
Lymphs Abs: 1.8 10*3/uL (ref 0.7–4.0)
MCHC: 34.5 g/dL (ref 30.0–36.0)
MCV: 80.7 fl (ref 78.0–100.0)
Monocytes Absolute: 1 10*3/uL (ref 0.1–1.0)
Monocytes Relative: 12 % (ref 3.0–12.0)
Neutro Abs: 5.4 10*3/uL (ref 1.4–7.7)
Neutrophils Relative %: 63.1 % (ref 43.0–77.0)
Platelets: 241 10*3/uL (ref 150.0–400.0)
RBC: 5.38 Mil/uL (ref 4.22–5.81)
RDW: 14.5 % (ref 11.5–15.5)
WBC: 8.5 10*3/uL (ref 4.0–10.5)

## 2022-07-17 LAB — COMPREHENSIVE METABOLIC PANEL
ALT: 38 U/L (ref 0–53)
AST: 34 U/L (ref 0–37)
Albumin: 4.2 g/dL (ref 3.5–5.2)
Alkaline Phosphatase: 73 U/L (ref 39–117)
BUN: 16 mg/dL (ref 6–23)
CO2: 28 mEq/L (ref 19–32)
Calcium: 9.9 mg/dL (ref 8.4–10.5)
Chloride: 99 mEq/L (ref 96–112)
Creatinine, Ser: 0.82 mg/dL (ref 0.40–1.50)
GFR: 97.04 mL/min (ref >60.00)
Glucose, Bld: 100 mg/dL — ABNORMAL HIGH (ref 70–99)
Potassium: 3.5 mEq/L (ref 3.5–5.1)
Sodium: 137 mEq/L (ref 135–145)
Total Bilirubin: 0.6 mg/dL (ref 0.2–1.2)
Total Protein: 7.8 g/dL (ref 6.0–8.3)

## 2022-07-17 MED ORDER — NAPROXEN 500 MG PO TABS: 500.0000 mg | ORAL_TABLET | Freq: Two times a day (BID) | ORAL | 0 refills | Status: DC

## 2022-07-17 MED ORDER — CYCLOBENZAPRINE HCL 10 MG PO TABS: 10.0000 mg | ORAL_TABLET | Freq: Three times a day (TID) | ORAL | 0 refills | Status: DC | PRN

## 2022-07-17 MED ORDER — METHYLPREDNISOLONE 4 MG PO TBPK: ORAL_TABLET | ORAL | 0 refills | Status: DC
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
## 2022-07-28 ENCOUNTER — Other Ambulatory Visit: Payer: Self-pay | Admitting: Adult Health

## 2022-07-28 DIAGNOSIS — M542 Cervicalgia: Secondary | ICD-10-CM

## 2022-07-29 ENCOUNTER — Encounter: Payer: Self-pay | Admitting: Adult Health

## 2022-07-30 NOTE — Telephone Encounter (Signed)
Please advise 

## 2022-08-12 ENCOUNTER — Encounter: Payer: Self-pay | Admitting: Interventional Cardiology

## 2022-08-16 ENCOUNTER — Other Ambulatory Visit: Payer: Self-pay | Admitting: Adult Health

## 2022-08-16 DIAGNOSIS — F411 Generalized anxiety disorder: Secondary | ICD-10-CM

## 2022-08-16 DIAGNOSIS — F3341 Major depressive disorder, recurrent, in partial remission: Secondary | ICD-10-CM

## 2022-08-18 ENCOUNTER — Ambulatory Visit: Payer: 59 | Attending: Interventional Cardiology

## 2022-08-18 ENCOUNTER — Encounter: Payer: Self-pay | Admitting: Adult Health

## 2022-08-18 DIAGNOSIS — E785 Hyperlipidemia, unspecified: Secondary | ICD-10-CM

## 2022-08-18 DIAGNOSIS — M542 Cervicalgia: Secondary | ICD-10-CM

## 2022-08-19 LAB — LIPID PANEL
Chol/HDL Ratio: 3.9 ratio (ref 0.0–5.0)
Cholesterol, Total: 144 mg/dL (ref 100–199)
HDL: 37 mg/dL — ABNORMAL LOW (ref >39)
LDL Chol Calc (NIH): 64 mg/dL (ref 0–99)
Triglycerides: 267 mg/dL — ABNORMAL HIGH (ref 0–149)
VLDL Cholesterol Cal: 43 mg/dL — ABNORMAL HIGH (ref 5–40)

## 2022-08-20 ENCOUNTER — Encounter: Payer: Self-pay | Admitting: Physical Therapy

## 2022-08-20 ENCOUNTER — Other Ambulatory Visit: Payer: Self-pay

## 2022-08-20 ENCOUNTER — Ambulatory Visit: Payer: 59 | Attending: Adult Health | Admitting: Physical Therapy

## 2022-08-20 DIAGNOSIS — M542 Cervicalgia: Secondary | ICD-10-CM | POA: Diagnosis not present

## 2022-08-20 DIAGNOSIS — R252 Cramp and spasm: Secondary | ICD-10-CM
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
## 2022-08-20 NOTE — Patient Instructions (Signed)

## 2022-08-24 ENCOUNTER — Ambulatory Visit: Payer: 59

## 2022-08-25 ENCOUNTER — Other Ambulatory Visit: Payer: Self-pay | Admitting: Adult Health

## 2022-09-01 ENCOUNTER — Ambulatory Visit: Payer: 59 | Admitting: Rehabilitative and Restorative Service Providers"

## 2022-09-07 ENCOUNTER — Encounter: Payer: 59 | Admitting: Physical Therapy

## 2022-09-10 ENCOUNTER — Encounter: Payer: 59 | Admitting: Physical Therapy

## 2022-09-15 ENCOUNTER — Encounter: Payer: 59 | Admitting: Rehabilitative and Restorative Service Providers"

## 2022-09-17 ENCOUNTER — Encounter: Payer: 59 | Admitting: Physical Therapy

## 2022-09-22 ENCOUNTER — Encounter: Payer: 59 | Admitting: Rehabilitative and Restorative Service Providers"

## 2022-09-29 ENCOUNTER — Encounter: Payer: 59 | Admitting: Rehabilitative and Restorative Service Providers"

## 2022-10-06 ENCOUNTER — Encounter: Payer: 59 | Admitting: Rehabilitative and Restorative Service Providers"

## 2022-10-06 ENCOUNTER — Ambulatory Visit: Payer: 59 | Admitting: Adult Health

## 2022-10-13 ENCOUNTER — Encounter: Payer: 59 | Admitting: Rehabilitative and Restorative Service Providers"

## 2022-10-14 ENCOUNTER — Encounter: Payer: Self-pay | Admitting: Adult Health

## 2022-10-14 ENCOUNTER — Ambulatory Visit (INDEPENDENT_AMBULATORY_CARE_PROVIDER_SITE_OTHER): Payer: 59 | Admitting: Adult Health

## 2022-10-14 VITALS — BP 112/80 | HR 88 | Temp 98.4°F | Ht 70.0 in | Wt 238.0 lb

## 2022-10-14 DIAGNOSIS — L409 Psoriasis, unspecified: Secondary | ICD-10-CM

## 2022-10-14 DIAGNOSIS — N529 Male erectile dysfunction, unspecified: Secondary | ICD-10-CM

## 2022-10-14 DIAGNOSIS — F411 Generalized anxiety disorder: Secondary | ICD-10-CM

## 2022-10-14 DIAGNOSIS — Z23 Encounter for immunization: Secondary | ICD-10-CM | POA: Diagnosis not present

## 2022-10-14 MED ORDER — FLUOXETINE HCL 20 MG PO TABS: 40.0000 mg | ORAL_TABLET | Freq: Every day | ORAL | 0 refills | Status: DC
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
## 2022-10-27 ENCOUNTER — Other Ambulatory Visit: Payer: Self-pay | Admitting: Adult Health

## 2022-11-17 ENCOUNTER — Other Ambulatory Visit: Payer: Self-pay | Admitting: Adult Health

## 2022-11-17 DIAGNOSIS — I10 Essential (primary) hypertension: Secondary | ICD-10-CM

## 2022-12-03 ENCOUNTER — Encounter: Payer: Self-pay | Admitting: Adult Health

## 2022-12-08 ENCOUNTER — Encounter: Payer: Self-pay | Admitting: Adult Health

## 2022-12-08 ENCOUNTER — Ambulatory Visit: Payer: 59 | Admitting: Adult Health

## 2022-12-08 VITALS — BP 120/82 | HR 82 | Temp 98.2°F | Ht 70.0 in | Wt 239.0 lb

## 2022-12-08 DIAGNOSIS — F411 Generalized anxiety disorder: Secondary | ICD-10-CM | POA: Diagnosis not present

## 2022-12-08 DIAGNOSIS — F3341 Major depressive disorder, recurrent, in partial remission: Secondary | ICD-10-CM | POA: Diagnosis not present

## 2022-12-08 DIAGNOSIS — Z6835 Body mass index (BMI) 35.0-35.9, adult: Secondary | ICD-10-CM

## 2022-12-08 DIAGNOSIS — E6609 Other obesity due to excess calories: Secondary | ICD-10-CM | POA: Diagnosis not present

## 2022-12-08 DIAGNOSIS — E66812 Obesity, class 2: Secondary | ICD-10-CM

## 2022-12-08 MED ORDER — SEMAGLUTIDE(0.25 OR 0.5MG/DOS) 2 MG/3ML ~~LOC~~ SOPN: 0.2500 mg | PEN_INJECTOR | SUBCUTANEOUS | 0 refills | Status: DC

## 2022-12-08 MED ORDER — BUSPIRONE HCL 10 MG PO TABS: 10.0000 mg | ORAL_TABLET | Freq: Two times a day (BID) | ORAL | 0 refills | Status: DC
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
## 2022-12-29 ENCOUNTER — Encounter: Payer: Self-pay | Admitting: Adult Health

## 2022-12-29 DIAGNOSIS — F411 Generalized anxiety disorder: Secondary | ICD-10-CM

## 2022-12-29 DIAGNOSIS — F3341 Major depressive disorder, recurrent, in partial remission: Secondary | ICD-10-CM

## 2022-12-30 ENCOUNTER — Encounter: Payer: Self-pay | Admitting: Adult Health

## 2022-12-31 ENCOUNTER — Other Ambulatory Visit: Payer: Self-pay | Admitting: Adult Health

## 2022-12-31 MED ORDER — BUPROPION HCL ER (XL) 150 MG PO TB24: 150.0000 mg | ORAL_TABLET | Freq: Every day | ORAL | 1 refills | Status: DC

## 2022-12-31 NOTE — Telephone Encounter (Signed)
Please advise 

## 2023-01-07 ENCOUNTER — Other Ambulatory Visit: Payer: Self-pay | Admitting: Adult Health

## 2023-01-07 DIAGNOSIS — F411 Generalized anxiety disorder: Secondary | ICD-10-CM

## 2023-01-07 DIAGNOSIS — F3341 Major depressive disorder, recurrent, in partial remission: Secondary | ICD-10-CM

## 2023-01-07 MED ORDER — BUSPIRONE HCL 10 MG PO TABS: 10.0000 mg | ORAL_TABLET | Freq: Two times a day (BID) | ORAL | 1 refills | Status: DC

## 2023-01-21 ENCOUNTER — Encounter: Payer: Self-pay | Admitting: Adult Health

## 2023-01-22 ENCOUNTER — Other Ambulatory Visit: Payer: Self-pay | Admitting: Adult Health

## 2023-01-22 MED ORDER — BUSPIRONE HCL 15 MG PO TABS: 15.0000 mg | ORAL_TABLET | Freq: Two times a day (BID) | ORAL | 0 refills | Status: DC

## 2023-01-22 NOTE — Telephone Encounter (Signed)
FYI

## 2023-01-31 ENCOUNTER — Other Ambulatory Visit: Payer: Self-pay | Admitting: Adult Health

## 2023-01-31 DIAGNOSIS — F3341 Major depressive disorder, recurrent, in partial remission: Secondary | ICD-10-CM

## 2023-01-31 DIAGNOSIS — F411 Generalized anxiety disorder: Secondary | ICD-10-CM

## 2023-02-16 IMAGING — DX DG FOOT COMPLETE 3+V*L*
3 series · 3 of 3 positions shown · non-contrast
Comparison: None Available.

CLINICAL DATA: Left second toe pain for 2-3 weeks.

EXAM:
LEFT FOOT - COMPLETE 3+ VIEW

[foot ap]
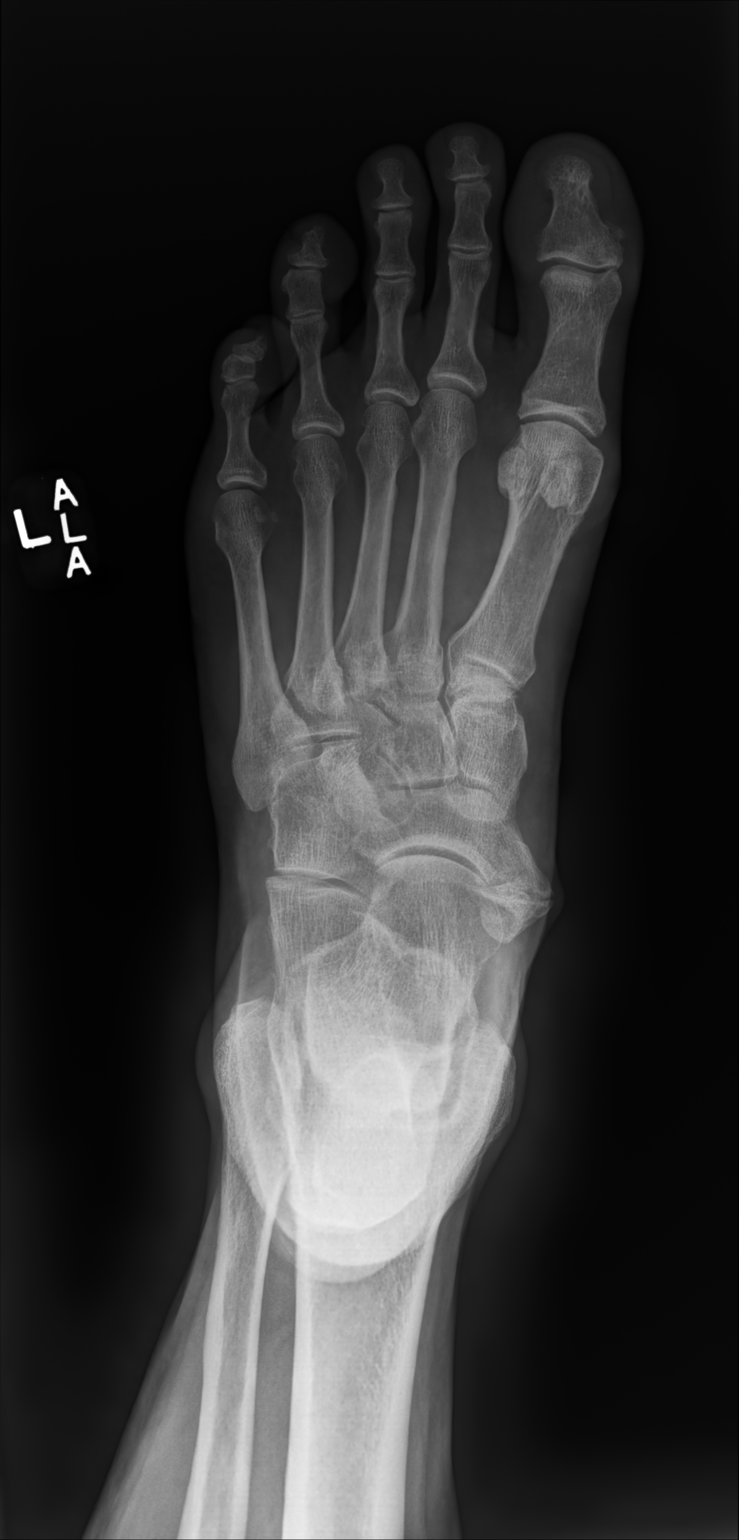

[foot mlo]
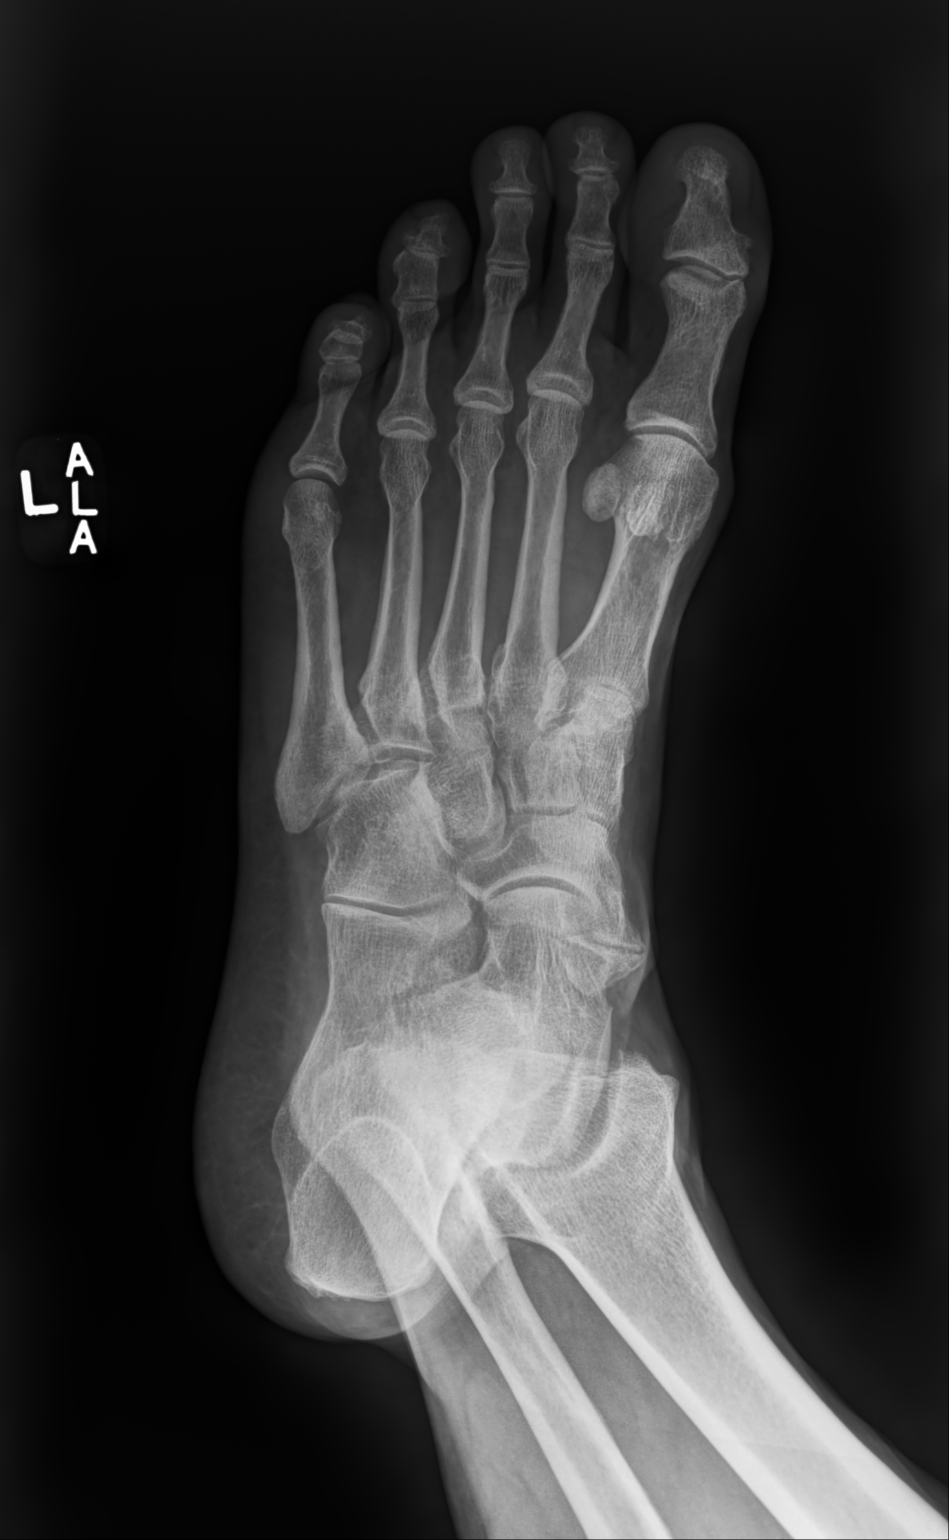

[foot lat]
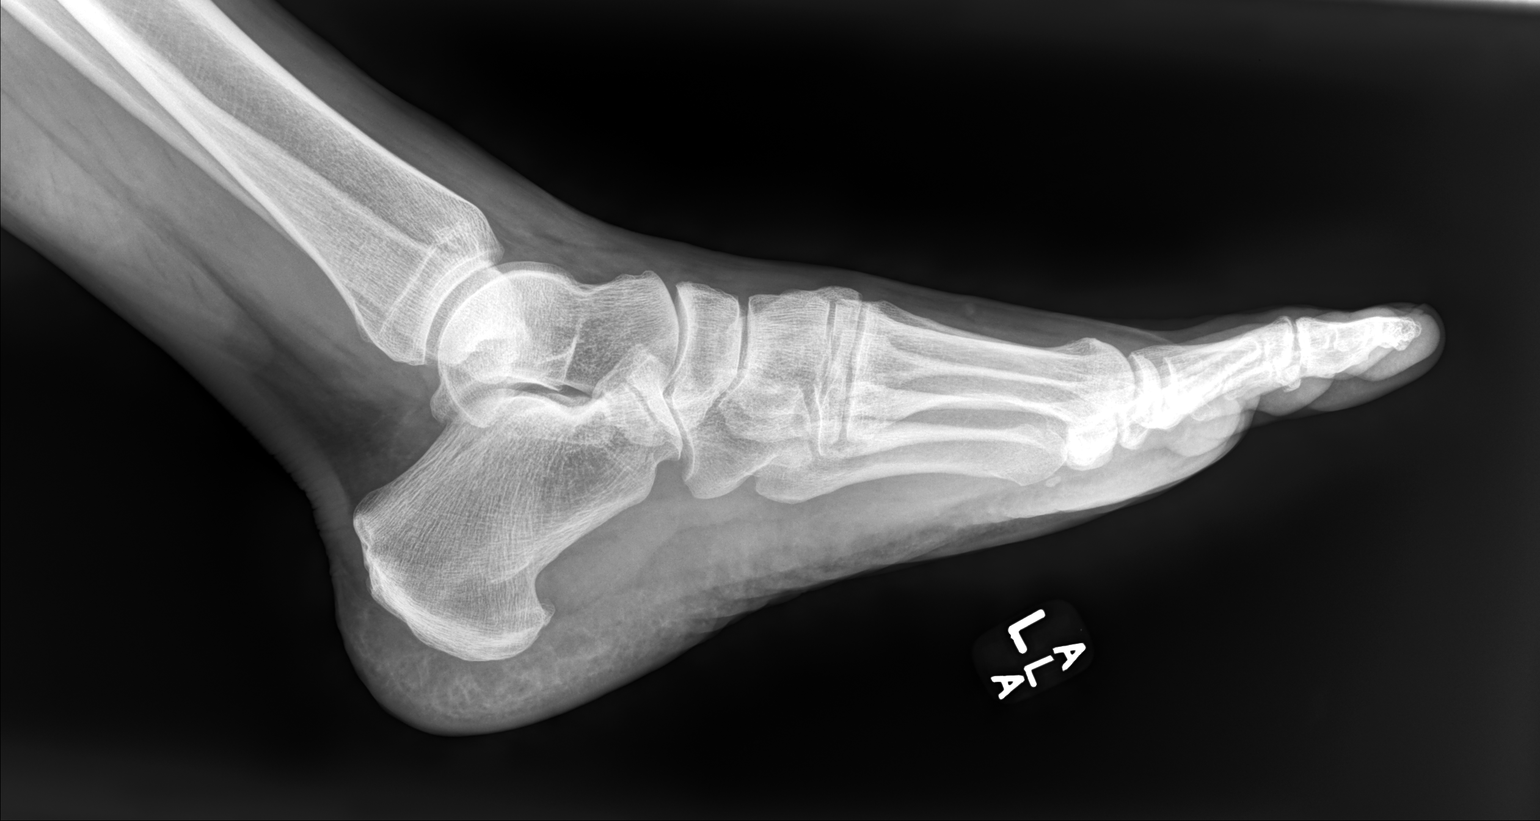

[3 of 3 positions shown; findings below may reference images not displayed]

FINDINGS: Normal bone mineralization. There is a large type 2 os naviculare
with moderate degenerative changes across the synchondrosis. Mild
plantar calcaneal heel spur. Minimal peripheral degenerative
spurring of the lateral aspect of the great toe metatarsophalangeal
joint but the joint space is preserved. Mild-to-moderate second
through fourth tarsometatarsal joint space narrowing and subchondral
sclerosis.

No acute fracture is seen, with attention to the second toe.
IMPRESSION: 1. No acute fracture is seen, with attention to the second toe.
2. Large type 2 os naviculare with moderate degenerative changes
across the synchondrosis.
3. Mild-to-moderate second through fourth tarsometatarsal
osteoarthritis.

## 2023-03-17 ENCOUNTER — Encounter: Payer: Self-pay | Admitting: Adult Health

## 2023-03-18 ENCOUNTER — Other Ambulatory Visit: Payer: Self-pay | Admitting: Adult Health

## 2023-03-18 MED ORDER — BUSPIRONE HCL 30 MG PO TABS: 30.0000 mg | ORAL_TABLET | Freq: Two times a day (BID) | ORAL | 1 refills | Status: DC

## 2023-03-19 ENCOUNTER — Telehealth: Payer: 59 | Admitting: Adult Health

## 2023-05-27 ENCOUNTER — Other Ambulatory Visit: Payer: Self-pay

## 2023-05-27 MED ORDER — ICOSAPENT ETHYL 1 G PO CAPS: 2.0000 g | ORAL_CAPSULE | Freq: Two times a day (BID) | ORAL | 1 refills | Status: DC

## 2023-05-31 ENCOUNTER — Other Ambulatory Visit: Payer: Self-pay | Admitting: Adult Health

## 2023-05-31 DIAGNOSIS — F411 Generalized anxiety disorder: Secondary | ICD-10-CM

## 2023-05-31 DIAGNOSIS — F3341 Major depressive disorder, recurrent, in partial remission: Secondary | ICD-10-CM

## 2023-06-01 ENCOUNTER — Ambulatory Visit: Payer: 59 | Admitting: Family Medicine

## 2023-06-01 VITALS — BP 136/86 | HR 68 | Temp 98.7°F | Wt 234.0 lb

## 2023-06-01 DIAGNOSIS — S20212A Contusion of left front wall of thorax, initial encounter: Secondary | ICD-10-CM

## 2023-06-01 DIAGNOSIS — S8002XA Contusion of left knee, initial encounter: Secondary | ICD-10-CM | POA: Diagnosis not present

## 2023-06-01 NOTE — Progress Notes (Signed)
   Subjective:    Patient ID: Paul Vazquez, male    DOB: 1966-07-03, 57 y.o.   MRN: [id number]  HPI Here to check on injuries he sustained this morning at home. He was carrying out garbage and this involved going down some steps. He apparently missed a step and fell forward, landing on his left knee and left side. No head trauma. He now has an area of mild pain in the left ribs, but he is not SOB. He also has some mild pain in the left knee, but he can walk on it easily. He has taken some Tylenol.    Review of Systems  Constitutional: Negative.   Respiratory: Negative.    Cardiovascular:  Positive for chest pain. Negative for palpitations and leg swelling.  Musculoskeletal:  Positive for arthralgias.       Objective:   Physical Exam Constitutional:      Appearance: Normal appearance.     Comments: He walks easily   Cardiovascular:     Rate and Rhythm: Normal rate and regular rhythm.     Pulses: Normal pulses.     Heart sounds: Normal heart sounds.  Pulmonary:     Effort: Pulmonary effort is normal.     Breath sounds: Normal breath sounds.     Comments: He is mildly tender in the left lower anterior ribs. No crepitus or bruising Musculoskeletal:     Comments: There is a small area of ecchymosis and swelling over the left patella. This is mildly tender, but there is no crepitus. He has full ROM of the knee.   Neurological:     Mental Status: He is alert.           Assessment & Plan:  He has contusions to the left ribs and the left patella. These should heal up over the next few weeks. He can take Ibuprofen and apply ice as needed.  Gershon Crane, MD

## 2023-06-01 NOTE — Telephone Encounter (Signed)
  The original prescription was discontinued on 01/07/2023 by Shirline Frees, NP for the following reason: Reorder. Renewing this prescription may not be appropriate.

## 2023-07-14 ENCOUNTER — Encounter: Payer: Self-pay | Admitting: Cardiology

## 2023-07-14 ENCOUNTER — Ambulatory Visit: Payer: 59 | Attending: Cardiology | Admitting: Cardiology

## 2023-07-14 VITALS — BP 136/84 | HR 95 | Ht 70.0 in | Wt 238.2 lb

## 2023-07-14 DIAGNOSIS — R931 Abnormal findings on diagnostic imaging of heart and coronary circulation: Secondary | ICD-10-CM

## 2023-07-14 DIAGNOSIS — I10 Essential (primary) hypertension: Secondary | ICD-10-CM | POA: Diagnosis not present

## 2023-07-14 NOTE — Patient Instructions (Signed)

## 2023-07-14 NOTE — Progress Notes (Signed)
  Cardiology Office Note:  .   Date:  07/14/2023  ID:  Paul Vazquez, DOB [birth year], MRN [id number] PCP: Shirline Frees, NP  [facility] Providers Cardiologist:  Donato Schultz, MD    History of Present Illness: .   Paul Vazquez is a 57 y.o. male Discussed the use of AI scribe software for clinical note transcription with the patient, who gave verbal consent to proceed.  History of Present Illness   Coronary artery disease, high cholesterol, high triglycerides, and high blood pressure, presents for a routine follow-up. He reports feeling well with no chest pain, shortness of breath, fainting, or bleeding. He had a coronary calcium score done 2023 which was 330. A subsequent stress test showed a small area inferior ischemia with EF 45%intermediate risk, but an echocardiogram showed normal heart function. The patient is currently on multiple medications, including Resuvastatin, losartan, amlodipine, and Visipa. He reports no side effects from these medications. The patient also expresses interest in a weight loss drug.       ROS: No CP no SOB  Studies Reviewed: .         Risk Assessment/Calculations:   LABS LDL cholesterol: 64 (08/18/2022) Creatinine: 0.8 (08/18/2022) Hemoglobin A1c: 5.6 (08/18/2022) Triglycerides: 267 (08/18/2022)  RADIOLOGY Coronary calcium score: 330, 89th percentile for age (05/15/2023)  DIAGNOSTIC REPORTS Stress test: Small area on basal inferior wall ischemia, intermediate risk, 45% EF (06/03/2022) Echocardiogram: Normal pump function, ejection fraction 60%, normal valve function (06/17/2022)         Physical Exam:   VS:  BP 136/84   Pulse 95   Ht 5\' 10"  (1.778 m)   Wt 238 lb 3.2 oz (108 kg)   SpO2 95%   BMI 34.18 kg/m    Wt Readings from Last 3 Encounters:  07/14/23 238 lb 3.2 oz (108 kg)  06/01/23 234 lb (106.1 kg)  12/08/22 239 lb (108.4 kg)    GEN: Well nourished, well developed in no acute distress NECK: No JVD; No carotid  bruits CARDIAC: RRR, no murmurs, rubs, gallops RESPIRATORY:  Clear to auscultation without rales, wheezing or rhonchi  ABDOMEN: Soft, non-tender, non-distended EXTREMITIES:  No edema; No deformity   ASSESSMENT AND PLAN: .   Assessment and Plan    Coronary Artery Disease Coronary calcium score of 330 (89th percentile for age). Stress test showed possible small area of decreased blood flow in the basal inferior wall, but subsequent echocardiogram showed normal pump function and ejection fraction of 60%. No current symptoms of chest pain or shortness of breath. -Continue Rosuvastatin 40mg  daily for plaque stabilization. -Continue Aspirin 81mg  daily for antiplatelet effect.  Hypertension -Continue Losartan 50mg  and Amlodipine 10mg  for blood pressure control.  Hypertriglyceridemia Triglyceride level of 267 on 08/18/2022. -Continue Vascepa 2 pills twice daily.  Weight Management Discussion of potential future use of weight loss medication pending changes in availability and cost. -Monitor for changes in availability and cost of weight loss medication.  Follow-up in 1 year or sooner if any new symptoms develop.              Signed, Donato Schultz, MD

## 2023-07-30 ENCOUNTER — Other Ambulatory Visit: Payer: Self-pay | Admitting: Cardiology

## 2023-09-01 ENCOUNTER — Encounter: Payer: Self-pay | Admitting: Adult Health

## 2023-09-01 ENCOUNTER — Other Ambulatory Visit (INDEPENDENT_AMBULATORY_CARE_PROVIDER_SITE_OTHER): Payer: 59

## 2023-09-01 ENCOUNTER — Ambulatory Visit: Payer: 59 | Admitting: Adult Health

## 2023-09-01 VITALS — BP 120/88 | HR 73 | Temp 98.2°F | Ht 70.75 in | Wt 237.0 lb

## 2023-09-01 DIAGNOSIS — N401 Enlarged prostate with lower urinary tract symptoms: Secondary | ICD-10-CM

## 2023-09-01 DIAGNOSIS — E785 Hyperlipidemia, unspecified: Secondary | ICD-10-CM | POA: Diagnosis not present

## 2023-09-01 DIAGNOSIS — R351 Nocturia: Secondary | ICD-10-CM

## 2023-09-01 DIAGNOSIS — F411 Generalized anxiety disorder: Secondary | ICD-10-CM | POA: Diagnosis not present

## 2023-09-01 DIAGNOSIS — I10 Essential (primary) hypertension: Secondary | ICD-10-CM

## 2023-09-01 DIAGNOSIS — Z0000 Encounter for general adult medical examination without abnormal findings: Secondary | ICD-10-CM | POA: Diagnosis not present

## 2023-09-01 DIAGNOSIS — N529 Male erectile dysfunction, unspecified: Secondary | ICD-10-CM

## 2023-09-01 DIAGNOSIS — Z23 Encounter for immunization: Secondary | ICD-10-CM

## 2023-09-01 LAB — CBC
HCT: 48.7 % (ref 39.0–52.0)
Hemoglobin: 16.2 g/dL (ref 13.0–17.0)
MCHC: 33.3 g/dL (ref 30.0–36.0)
MCV: 82.3 fL (ref 78.0–100.0)
Platelets: 224 10*3/uL (ref 150.0–400.0)
RBC: 5.92 Mil/uL — ABNORMAL HIGH (ref 4.22–5.81)
RDW: 14.1 % (ref 11.5–15.5)
WBC: 8.2 10*3/uL (ref 4.0–10.5)

## 2023-09-01 LAB — COMPREHENSIVE METABOLIC PANEL
ALT: 44 U/L (ref 0–53)
AST: 32 U/L (ref 0–37)
Albumin: 4.7 g/dL (ref 3.5–5.2)
Alkaline Phosphatase: 72 U/L (ref 39–117)
BUN: 12 mg/dL (ref 6–23)
CO2: 28 meq/L (ref 19–32)
Calcium: 9.8 mg/dL (ref 8.4–10.5)
Chloride: 103 meq/L (ref 96–112)
Creatinine, Ser: 0.79 mg/dL (ref 0.40–1.50)
GFR: 97.37 mL/min (ref >60.00)
Glucose, Bld: 109 mg/dL — ABNORMAL HIGH (ref 70–99)
Potassium: 4.1 meq/L (ref 3.5–5.1)
Sodium: 141 meq/L (ref 135–145)
Total Bilirubin: 0.7 mg/dL (ref 0.2–1.2)
Total Protein: 7.4 g/dL (ref 6.0–8.3)

## 2023-09-01 LAB — LIPID PANEL
Cholesterol: 110 mg/dL (ref 0–200)
HDL: 35.7 mg/dL — ABNORMAL LOW (ref >39.00)
LDL Cholesterol: 25 mg/dL (ref 0–99)
NonHDL: 74.48
Total CHOL/HDL Ratio: 3
Triglycerides: 249 mg/dL — ABNORMAL HIGH (ref 0.0–149.0)
VLDL: 49.8 mg/dL — ABNORMAL HIGH (ref 0.0–40.0)

## 2023-09-01 LAB — TSH: TSH: 1.87 u[IU]/mL (ref 0.35–5.50)

## 2023-09-01 LAB — PSA: PSA: 1.67 ng/mL (ref 0.10–4.00)

## 2023-09-01 MED ORDER — FLUVOXAMINE MALEATE 25 MG PO TABS
25.0000 mg | ORAL_TABLET | Freq: Every day | ORAL | 0 refills | Status: DC
Start: 2023-09-01 — End: 2023-12-02
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
## 2023-09-01 NOTE — Patient Instructions (Signed)
It was great seeing you today   We will follow up with you regarding your lab work   Please let me know if you need anything

## 2023-09-21 ENCOUNTER — Other Ambulatory Visit: Payer: Self-pay | Admitting: Adult Health

## 2023-09-22 ENCOUNTER — Telehealth: Payer: 59 | Admitting: Adult Health

## 2023-09-22 ENCOUNTER — Encounter: Payer: Self-pay | Admitting: Adult Health

## 2023-09-22 DIAGNOSIS — F32A Depression, unspecified: Secondary | ICD-10-CM | POA: Diagnosis not present

## 2023-09-22 DIAGNOSIS — F419 Anxiety disorder, unspecified: Secondary | ICD-10-CM

## 2023-09-22 MED ORDER — FLUVOXAMINE MALEATE 50 MG PO TABS
50.0000 mg | ORAL_TABLET | Freq: Every day | ORAL | 0 refills | Status: DC
Start: 2023-09-22 — End: 2023-12-02
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
## 2023-09-25 ENCOUNTER — Other Ambulatory Visit: Payer: Self-pay | Admitting: Cardiology

## 2023-10-15 ENCOUNTER — Other Ambulatory Visit: Payer: Self-pay | Admitting: Adult Health

## 2023-10-15 DIAGNOSIS — F411 Generalized anxiety disorder: Secondary | ICD-10-CM

## 2023-10-15 DIAGNOSIS — F3341 Major depressive disorder, recurrent, in partial remission: Secondary | ICD-10-CM

## 2023-10-15 DIAGNOSIS — I10 Essential (primary) hypertension: Secondary | ICD-10-CM

## 2023-10-22 ENCOUNTER — Encounter: Payer: Self-pay | Admitting: Adult Health

## 2023-11-24 ENCOUNTER — Encounter: Payer: Self-pay | Admitting: Adult Health

## 2023-11-25 NOTE — Telephone Encounter (Signed)
**Note De-identified  [place] Obfuscation** Please advise 

## 2023-11-26 ENCOUNTER — Telehealth: Payer: 59 | Admitting: Adult Health

## 2023-12-02 ENCOUNTER — Telehealth: Payer: 59 | Admitting: Adult Health

## 2023-12-02 DIAGNOSIS — F32A Depression, unspecified: Secondary | ICD-10-CM

## 2023-12-02 DIAGNOSIS — F419 Anxiety disorder, unspecified: Secondary | ICD-10-CM

## 2023-12-02 MED ORDER — FLUVOXAMINE MALEATE 100 MG PO TABS: 100.0000 mg | ORAL_TABLET | Freq: Every day | ORAL | 0 refills | Status: DC
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
## 2023-12-19 ENCOUNTER — Other Ambulatory Visit: Payer: Self-pay | Admitting: Adult Health

## 2023-12-19 DIAGNOSIS — F32A Depression, unspecified: Secondary | ICD-10-CM

## 2023-12-20 NOTE — Telephone Encounter (Signed)
The original prescription was discontinued on 12/02/2023 by Shirline Frees, NP. Renewing this prescription may not be appropriate.

## 2024-01-06 ENCOUNTER — Other Ambulatory Visit: Payer: Self-pay | Admitting: Adult Health

## 2024-01-06 DIAGNOSIS — F32A Depression, unspecified: Secondary | ICD-10-CM

## 2024-01-14 ENCOUNTER — Telehealth: Payer: 59 | Admitting: Adult Health

## 2024-01-14 VITALS — Ht 70.5 in | Wt 237.0 lb

## 2024-01-14 DIAGNOSIS — F419 Anxiety disorder, unspecified: Secondary | ICD-10-CM

## 2024-01-14 DIAGNOSIS — F32A Depression, unspecified: Secondary | ICD-10-CM

## 2024-01-14 DIAGNOSIS — G479 Sleep disorder, unspecified: Secondary | ICD-10-CM

## 2024-01-14 MED ORDER — TRAZODONE HCL 50 MG PO TABS: 25.0000 mg | ORAL_TABLET | Freq: Every evening | ORAL | 1 refills | Status: DC | PRN
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
## 2024-01-26 ENCOUNTER — Encounter: Payer: Self-pay | Admitting: Adult Health

## 2024-01-26 NOTE — Telephone Encounter (Signed)
**Note De-identified  [place] Obfuscation** Please advise 

## 2024-02-01 ENCOUNTER — Other Ambulatory Visit: Payer: Self-pay | Admitting: Adult Health

## 2024-02-01 MED ORDER — FLUOXETINE HCL 40 MG PO CAPS: 40.0000 mg | ORAL_CAPSULE | Freq: Every day | ORAL | 0 refills | Status: DC

## 2024-02-10 ENCOUNTER — Other Ambulatory Visit: Payer: Self-pay | Admitting: Adult Health

## 2024-02-10 DIAGNOSIS — F3341 Major depressive disorder, recurrent, in partial remission: Secondary | ICD-10-CM

## 2024-02-10 DIAGNOSIS — F411 Generalized anxiety disorder: Secondary | ICD-10-CM

## 2024-02-15 ENCOUNTER — Encounter: Payer: Self-pay | Admitting: Adult Health

## 2024-02-16 ENCOUNTER — Other Ambulatory Visit: Payer: Self-pay | Admitting: Adult Health

## 2024-02-16 MED ORDER — FLUOXETINE HCL 40 MG PO CAPS: ORAL_CAPSULE | ORAL | 1 refills | Status: DC

## 2024-02-25 ENCOUNTER — Encounter (HOSPITAL_BASED_OUTPATIENT_CLINIC_OR_DEPARTMENT_OTHER): Payer: Self-pay | Admitting: Emergency Medicine

## 2024-02-25 ENCOUNTER — Emergency Department (HOSPITAL_BASED_OUTPATIENT_CLINIC_OR_DEPARTMENT_OTHER): Admitting: Radiology

## 2024-02-25 ENCOUNTER — Other Ambulatory Visit: Payer: Self-pay

## 2024-02-25 ENCOUNTER — Emergency Department (HOSPITAL_BASED_OUTPATIENT_CLINIC_OR_DEPARTMENT_OTHER)
Admission: EM | Admit: 2024-02-25 | Discharge: 2024-02-25 | Disposition: A | Discharge status: Home / Self Care | Attending: Emergency Medicine | Admitting: Emergency Medicine

## 2024-02-25 DIAGNOSIS — Z79899 Other long term (current) drug therapy: Secondary | ICD-10-CM | POA: Insufficient documentation

## 2024-02-25 DIAGNOSIS — R55 Syncope and collapse: Secondary | ICD-10-CM | POA: Diagnosis not present

## 2024-02-25 DIAGNOSIS — R42 Dizziness and giddiness: Secondary | ICD-10-CM | POA: Diagnosis present

## 2024-02-25 DIAGNOSIS — Z7982 Long term (current) use of aspirin: Secondary | ICD-10-CM | POA: Insufficient documentation

## 2024-02-25 LAB — CBC
HCT: 45.7 % (ref 39.0–52.0)
Hemoglobin: 16 g/dL (ref 13.0–17.0)
MCH: 28.3 pg (ref 26.0–34.0)
MCHC: 35 g/dL (ref 30.0–36.0)
MCV: 80.7 fL (ref 80.0–100.0)
Platelets: 236 10*3/uL (ref 150–400)
RBC: 5.66 MIL/uL (ref 4.22–5.81)
RDW: 13.5 % (ref 11.5–15.5)
WBC: 9.1 10*3/uL (ref 4.0–10.5)
nRBC: 0 % (ref 0.0–0.2)

## 2024-02-25 LAB — BASIC METABOLIC PANEL WITH GFR
Anion gap: 9 (ref 5–15)
BUN: 15 mg/dL (ref 6–20)
CO2: 23 mmol/L (ref 22–32)
Calcium: 9.3 mg/dL (ref 8.9–10.3)
Chloride: 104 mmol/L (ref 98–111)
Creatinine, Ser: 0.73 mg/dL (ref 0.61–1.24)
GFR, Estimated: >60 mL/min (ref >60)
Glucose, Bld: 122 mg/dL — ABNORMAL HIGH (ref 70–99)
Potassium: 3.5 mmol/L (ref 3.5–5.1)
Sodium: 136 mmol/L (ref 135–145)

## 2024-02-25 LAB — URINALYSIS, ROUTINE W REFLEX MICROSCOPIC
Bacteria, UA: NONE SEEN
Bilirubin Urine: NEGATIVE
Glucose, UA: NEGATIVE mg/dL
Hgb urine dipstick: NEGATIVE
Ketones, ur: NEGATIVE mg/dL
Leukocytes,Ua: NEGATIVE
Nitrite: NEGATIVE
Protein, ur: NEGATIVE mg/dL
Specific Gravity, Urine: 1.016 (ref 1.005–1.030)
pH: 6.5 (ref 5.0–8.0)

## 2024-02-25 LAB — CBG MONITORING, ED: Glucose-Capillary: 120 mg/dL — ABNORMAL HIGH (ref 70–99)

## 2024-02-25 NOTE — ED Provider Notes (Signed)
 [name] Provider Note   CSN: [id number] Arrival date & time: 02/25/24  2038     History  Chief Complaint  Patient presents with   Weakness    Paul Vazquez is a 58 y.o. male.   Weakness Patient presents with episodes of near syncope.  Is about 3 of them today.  First 1 started while in the bathroom.  States he was urinating and then felt tingly all over.  Had nausea and felt as if he would throw up.  Felt lightheaded.  Mild nausea still. Still had 2 more episodes while in the car.  Was not going to the bathroom at that time.  Feeling better now.  Has been doing well otherwise today.  Around 3 weeks ago had some change in his depression medicines but has been doing fine with that.    Past Medical History:  Diagnosis Date   Anxiety    Hx of adenomatous colonic polyps    Hyperlipidemia     Home Medications Prior to Admission medications   Medication Sig Start Date End Date Taking? Authorizing Provider  amLODipine (NORVASC) 10 MG tablet TAKE 1 TABLET DAILY 10/19/23   Nafziger, Kandee Keen, NP  BABY ASPIRIN PO Take 81 mg by mouth daily in the afternoon. 05/17/22   [provider]  buPROPion (WELLBUTRIN XL) 150 MG 24 hr tablet TAKE 1 TABLET DAILY 02/10/24   Nafziger, Kandee Keen, NP  FLUoxetine (PROZAC) 40 MG capsule ALTERNATE 1 CAPSULE (40MG ) EVERY OTHER DAY AND 2   CAPSULES (80MG ) EVERY OTHERDAY 02/16/24   Nafziger, Kandee Keen, NP  icosapent Ethyl (VASCEPA) 1 g capsule TAKE 2 CAPSULES BY MOUTH 2 TIMES DAILY. 09/28/23   Jake Bathe, MD  losartan (COZAAR) 50 MG tablet TAKE 1 TABLET DAILY 10/19/23   Shirline Frees, NP  Multiple Vitamin (MULTIVITAMIN) tablet Take 1 tablet by mouth daily.    [provider]  rosuvastatin (CRESTOR) 40 MG tablet TAKE 1 TABLET DAILY 01/07/24   Nafziger, Kandee Keen, NP  sildenafil (VIAGRA) 100 MG tablet Take 1 tablet (100 mg total) by mouth as needed for erectile dysfunction. 03/25/22   Shirline Frees, NP       Allergies    Patient has no known allergies.    Review of Systems   Review of Systems  Neurological:  Positive for weakness.    Physical Exam Updated Vital Signs BP 138/80   Pulse 72   Temp 98.9 F (37.2 C)   Resp 18   SpO2 93%  Physical Exam Vitals and nursing note reviewed.  Cardiovascular:     Rate and Rhythm: Normal rate.  Pulmonary:     Breath sounds: No wheezing.  Abdominal:     Tenderness: There is no abdominal tenderness.  Musculoskeletal:        General: No tenderness.  Skin:    Capillary Refill: Capillary refill takes less than 2 seconds.  Neurological:     Mental Status: He is alert and oriented to person, place, and time.     ED Results / Procedures / Treatments   Labs (all labs ordered are listed, but only abnormal results are displayed) Labs Reviewed  BASIC METABOLIC PANEL WITH GFR - Abnormal; Notable for the following components:      Result Value   Glucose, Bld 122 (*)    All other components within normal limits  URINALYSIS, ROUTINE W REFLEX MICROSCOPIC - Abnormal; Notable for the following components:   APPearance HAZY (*)    All  other components within normal limits  CBG MONITORING, ED - Abnormal; Notable for the following components:   Glucose-Capillary 120 (*)    All other components within normal limits  CBC    EKG EKG Interpretation Date/Time:  Friday February 25 2024 20:47:20 EDT Ventricular Rate:  72 PR Interval:  168 QRS Duration:  100 QT Interval:  408 QTC Calculation: 446 R Axis:   -11  Text Interpretation: Normal sinus rhythm Incomplete right bundle branch block Cannot rule out Anterior infarct , age undetermined Abnormal ECG Confirmed by Benjiman Core [phone]) on 02/25/2024 9:17:49 PM  Radiology DG Chest 2 View Result Date: 02/25/2024 CLINICAL DATA:  palpitations EXAM: CHEST - 2 VIEW COMPARISON:  CT cardiac 05/14/2022. FINDINGS: The heart and mediastinal contours are within normal limits. No focal consolidation. No  pulmonary edema. No pleural effusion. No pneumothorax. No acute osseous abnormality. IMPRESSION: No active cardiopulmonary disease. Electronically Signed   By: Tish Frederickson M.D.   On: 02/25/2024 22:53    Procedures Procedures    Medications Ordered in ED Medications - No data to display  ED Course/ Medical Decision Making/ A&P                                 Medical Decision Making Amount and/or Complexity of Data Reviewed Labs: ordered. Radiology: ordered.   Patient with episode of feeling bad.  Initial while urinating later was not urinating.  Differential diagnosis does include cause such as vagal events but also arrhythmia.  Blood work reassuring.  Chest x-ray pending.  Should likely be able to follow-up with cardiology.  Workup reassuring.  Chest chest x-ray negative.  Reviewed monitor strip here and no arrhythmia.  Will discharge home.  Does have mild hyperglycemia with needs to be followed.        Final Clinical Impression(s) / ED Diagnoses Final diagnoses:  Near syncope    Rx / DC Orders ED Discharge Orders          Ordered    Ambulatory referral to Cardiology       Comments: If you have not heard from the Cardiology office within the next 72 hours please call [phone].   02/25/24 [id number]              Benjiman Core, MD 02/25/24 2328

## 2024-02-25 NOTE — ED Triage Notes (Signed)
 Several episodes of head to toe tingling, fatigue nausea near syncopal feeling. Last about 10 seconds Then fatigue and nausea continue, headache

## 2024-02-25 NOTE — ED Notes (Signed)
 RN reviewed discharge instructions with pt. Pt verbalized understanding and had no further questions. VSS upon discharge.

## 2024-03-12 ENCOUNTER — Other Ambulatory Visit: Payer: Self-pay | Admitting: Adult Health

## 2024-03-12 DIAGNOSIS — G479 Sleep disorder, unspecified: Secondary | ICD-10-CM

## 2024-03-14 NOTE — Telephone Encounter (Signed)
  The original prescription was discontinued on 02/01/2024 by Alto Atta, NP. Renewing this prescription may not be appropriate.

## 2024-03-26 ENCOUNTER — Encounter: Payer: Self-pay | Admitting: Adult Health

## 2024-03-28 NOTE — Telephone Encounter (Signed)
 Please advise I appt. Is appropriate.

## 2024-04-13 ENCOUNTER — Ambulatory Visit

## 2024-04-13 ENCOUNTER — Encounter: Payer: Self-pay | Admitting: Adult Health

## 2024-04-13 ENCOUNTER — Ambulatory Visit: Admitting: Adult Health

## 2024-04-13 VITALS — BP 120/88 | HR 91 | Temp 98.5°F | Ht 70.5 in | Wt 234.0 lb

## 2024-04-13 DIAGNOSIS — R1032 Left lower quadrant pain: Secondary | ICD-10-CM | POA: Diagnosis not present

## 2024-04-13 DIAGNOSIS — F32A Depression, unspecified: Secondary | ICD-10-CM

## 2024-04-13 DIAGNOSIS — F419 Anxiety disorder, unspecified: Secondary | ICD-10-CM | POA: Diagnosis not present

## 2024-04-13 MED ORDER — CITALOPRAM HYDROBROMIDE 20 MG PO TABS: 20.0000 mg | ORAL_TABLET | Freq: Every day | ORAL | 3 refills | Status: DC
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
## 2024-04-26 ENCOUNTER — Ambulatory Visit: Payer: Self-pay | Admitting: Adult Health

## 2024-04-26 NOTE — Telephone Encounter (Signed)
 FYI

## 2024-05-09 ENCOUNTER — Ambulatory Visit: Admitting: Adult Health

## 2024-05-27 ENCOUNTER — Encounter: Payer: Self-pay | Admitting: Adult Health

## 2024-05-31 ENCOUNTER — Other Ambulatory Visit: Payer: Self-pay | Admitting: Adult Health

## 2024-05-31 DIAGNOSIS — F419 Anxiety disorder, unspecified: Secondary | ICD-10-CM

## 2024-05-31 MED ORDER — CITALOPRAM HYDROBROMIDE 20 MG PO TABS: 20.0000 mg | ORAL_TABLET | Freq: Every day | ORAL | 1 refills | Status: DC

## 2024-05-31 NOTE — Telephone Encounter (Signed)
**Note De-identified  [place] Obfuscation** Please advise 

## 2024-06-10 ENCOUNTER — Other Ambulatory Visit: Payer: Self-pay | Admitting: Adult Health

## 2024-06-10 DIAGNOSIS — F3341 Major depressive disorder, recurrent, in partial remission: Secondary | ICD-10-CM

## 2024-06-10 DIAGNOSIS — F411 Generalized anxiety disorder: Secondary | ICD-10-CM

## 2024-06-10 DIAGNOSIS — I10 Essential (primary) hypertension: Secondary | ICD-10-CM

## 2024-06-19 ENCOUNTER — Encounter: Payer: Self-pay | Admitting: Adult Health

## 2024-06-19 DIAGNOSIS — F419 Anxiety disorder, unspecified: Secondary | ICD-10-CM

## 2024-06-19 DIAGNOSIS — N529 Male erectile dysfunction, unspecified: Secondary | ICD-10-CM

## 2024-06-19 DIAGNOSIS — I10 Essential (primary) hypertension: Secondary | ICD-10-CM

## 2024-06-19 DIAGNOSIS — F411 Generalized anxiety disorder: Secondary | ICD-10-CM

## 2024-06-19 DIAGNOSIS — F3341 Major depressive disorder, recurrent, in partial remission: Secondary | ICD-10-CM

## 2024-06-20 MED ORDER — LOSARTAN POTASSIUM 50 MG PO TABS: 50.0000 mg | ORAL_TABLET | Freq: Every day | ORAL | 3 refills | Status: AC

## 2024-06-20 MED ORDER — AMLODIPINE BESYLATE 10 MG PO TABS: 10.0000 mg | ORAL_TABLET | Freq: Every day | ORAL | 3 refills | Status: AC

## 2024-06-20 MED ORDER — ROSUVASTATIN CALCIUM 40 MG PO TABS: 40.0000 mg | ORAL_TABLET | Freq: Every day | ORAL | 1 refills | Status: DC

## 2024-06-20 MED ORDER — CITALOPRAM HYDROBROMIDE 20 MG PO TABS: 20.0000 mg | ORAL_TABLET | Freq: Every day | ORAL | 1 refills | Status: DC

## 2024-06-20 MED ORDER — SILDENAFIL CITRATE 100 MG PO TABS
100.0000 mg | ORAL_TABLET | ORAL | 3 refills | Status: AC | PRN
Start: 2024-06-20 — End: ?

## 2024-06-20 MED ORDER — BUPROPION HCL ER (XL) 150 MG PO TB24
150.0000 mg | ORAL_TABLET | Freq: Every day | ORAL | 1 refills | Status: DC
Start: 2024-06-20 — End: 2024-09-20

## 2024-06-20 NOTE — Addendum Note (Signed)
 Addended by: VICCI LEADER R on: 06/20/2024 04:52 PM   Modules accepted: Orders

## 2024-06-25 ENCOUNTER — Other Ambulatory Visit: Payer: Self-pay | Admitting: Cardiology

## 2024-06-27 ENCOUNTER — Encounter: Payer: Self-pay | Admitting: Cardiology

## 2024-06-27 ENCOUNTER — Other Ambulatory Visit: Payer: Self-pay

## 2024-06-27 MED ORDER — ICOSAPENT ETHYL 1 G PO CAPS: 2.0000 g | ORAL_CAPSULE | Freq: Two times a day (BID) | ORAL | 0 refills | Status: DC

## 2024-06-30 ENCOUNTER — Telehealth: Payer: Self-pay

## 2024-06-30 ENCOUNTER — Other Ambulatory Visit (HOSPITAL_COMMUNITY): Payer: Self-pay

## 2024-06-30 MED ORDER — OMEGA-3-ACID ETHYL ESTERS 1 G PO CAPS: 2.0000 g | ORAL_CAPSULE | Freq: Two times a day (BID) | ORAL | 3 refills | Status: AC

## 2024-06-30 NOTE — Telephone Encounter (Signed)
 OK to change order to Lovaza  d/t insurance coverage?

## 2024-06-30 NOTE — Telephone Encounter (Signed)
 Pharmacy Patient Advocate Encounter   Received notification from Patient Pharmacy that prior authorization for VASCEPA  is required/requested.   Insurance verification completed.   The patient is insured through [facility] .   Per test claim:  OMEGA 3 ACID ETHYL ESTERS (LOVAZA ) is preferred by the insurance.  If suggested medication is appropriate, Please send in a new RX and discontinue this one. If not, please advise as to why it's not appropriate so that we may request a Prior Authorization. Please note, some preferred medications may still require a PA.  If the suggested medications have not been trialed and there are no contraindications to their use, the PA will not be submitted, as it will not be approved.  PLEASE ADVISE

## 2024-06-30 NOTE — Telephone Encounter (Signed)
 Jeffrie Oneil BROCKS, MD to Me (Selected Message)    06/30/24  3:38 PM Yes okay to change to Lovaza  Oneil Jeffrie, MD   RX sent into [facility]

## 2024-07-19 ENCOUNTER — Encounter: Payer: Self-pay | Admitting: Adult Health

## 2024-07-28 ENCOUNTER — Ambulatory Visit: Payer: Self-pay | Attending: Cardiology | Admitting: Cardiology

## 2024-07-28 ENCOUNTER — Encounter: Payer: Self-pay | Admitting: Cardiology

## 2024-07-28 VITALS — BP 118/81 | HR 88 | Ht 70.5 in | Wt 235.0 lb

## 2024-07-28 DIAGNOSIS — E782 Mixed hyperlipidemia: Secondary | ICD-10-CM | POA: Diagnosis not present

## 2024-07-28 DIAGNOSIS — R931 Abnormal findings on diagnostic imaging of heart and coronary circulation: Secondary | ICD-10-CM

## 2024-07-28 DIAGNOSIS — I10 Essential (primary) hypertension: Secondary | ICD-10-CM | POA: Diagnosis not present

## 2024-07-28 NOTE — Progress Notes (Signed)
 Cardiology Office Note:  .   Date:  07/28/2024  ID:  Paul Vazquez, DOB [birth year], MRN [id number] PCP: Merna Huxley, NP  [facility] Providers Cardiologist:  Oneil Parchment, MD     History of Present Illness: .     History of Present Illness Paul Vazquez is a 58 year old male with coronary artery disease who presents for a follow-up visit.  He has a history of coronary artery disease with a coronary calcium  score of 330 in 2023, placing him in the 89th percentile for his age. A stress test in 2023 showed a small area of inferior ischemia, and an echocardiogram revealed normal pump function. He is currently on Crestor , losartan , amlodipine , and Lovaza  for management. No new symptoms or issues since his last visit.  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 His triglyceride levels were 267 previously and 249 in October of last year.  He is on losartan  50 mg daily and amlodipine  10 mg daily for hypertension. No swelling in the legs from amlodipine .  He experiences a sensation of hearing his heartbeat when lying down at night, describing it as feeling like it 'moves my ears almost when I'm on the pillow.' He finds this sensation a bit scary but has not noticed any other symptoms such as chest pressure or discomfort.   Studies Reviewed: .        Results LABS Triglycerides: 249 (2024-10)  RADIOLOGY Coronary calcium  score: 330, eighty-ninth percentile for age (2024)  DIAGNOSTIC Stress test: Small inferior ischemia (2023) Echocardiogram: Normal pump function (2023) Risk Assessment/Calculations:            Physical Exam:   VS:  BP 118/81   Pulse 88   Ht 5' 10.5 (1.791 m)   Wt 235 lb (106.6 kg)   SpO2 96%   BMI 33.24 kg/m    Wt Readings from Last 3 Encounters:  07/28/24 235 lb (106.6 kg)  04/13/24 234 lb (106.1 kg)  01/14/24 237 lb (107.5 kg)    GEN: Well nourished, well developed in no acute distress NECK: No  JVD; No carotid bruits CARDIAC: RRR, no murmurs, no rubs, no gallops RESPIRATORY:  Clear to auscultation without rales, wheezing or rhonchi  ABDOMEN: Soft, non-tender, non-distended EXTREMITIES:  No edema; No deformity   ASSESSMENT AND PLAN: .    Assessment and Plan Assessment & Plan Atherosclerotic heart disease of native coronary artery without angina pectoris Atherosclerotic heart disease is managed preventively. Previous coronary calcium  score 330  was in the 89th percentile for age, and a stress test showed small area of inferior ischemia. Echocardiogram showed normal ejection fraction. Current management includes medications to control risk factors. No new symptoms reported, and blood pressure is well-controlled. The sensation of hearing the heartbeat when lying down is attributed to sound conduction and positional changes, not indicative of acute issues. - Continue current medications: Rosuvastatin , Losartan , Amlodipine , Lovaza , and Aspirin. - Monitor for any new symptoms such as chest pressure or discomfort, especially with exertion. - Schedule follow-up in one year to reassess condition and management plan.  Essential hypertension Hypertension is well-controlled with current medication regimen. Blood pressure readings are within target range. No adverse effects such as swelling from Amlodipine  reported. - Continue Losartan  50 mg daily and Amlodipine  10 mg daily.  Hyperlipidemia, unspecified Hyperlipidemia is managed with Rosuvastatin . Current management focuses on prevention of cardiovascular events. No new issues reported. - Continue Rosuvastatin  40 mg daily.  Hypertriglyceridemia Hypertriglyceridemia is  managed with Lovaza , as insurance no longer covers Vascepa . Previous triglyceride levels were 267 mg/dL, slightly elevated from 249 mg/dL last year. No immediate need for retesting, but monitoring is planned. - Continue Lovaza  as prescribed. - Plan for triglyceride level check at  next routine visit with primary care provider.        Signed, Oneil Parchment, MD

## 2024-07-28 NOTE — Patient Instructions (Signed)

## 2024-09-01 ENCOUNTER — Ambulatory Visit (INDEPENDENT_AMBULATORY_CARE_PROVIDER_SITE_OTHER): Admitting: Adult Health

## 2024-09-01 VITALS — BP 118/72 | HR 68 | Temp 98.0°F | Ht 70.5 in | Wt 243.0 lb

## 2024-09-01 DIAGNOSIS — N400 Enlarged prostate without lower urinary tract symptoms: Secondary | ICD-10-CM

## 2024-09-01 DIAGNOSIS — N529 Male erectile dysfunction, unspecified: Secondary | ICD-10-CM | POA: Diagnosis not present

## 2024-09-01 DIAGNOSIS — F32A Depression, unspecified: Secondary | ICD-10-CM

## 2024-09-01 DIAGNOSIS — I10 Essential (primary) hypertension: Secondary | ICD-10-CM

## 2024-09-01 DIAGNOSIS — Z0000 Encounter for general adult medical examination without abnormal findings: Secondary | ICD-10-CM | POA: Diagnosis not present

## 2024-09-01 DIAGNOSIS — E785 Hyperlipidemia, unspecified: Secondary | ICD-10-CM | POA: Diagnosis not present

## 2024-09-01 DIAGNOSIS — F419 Anxiety disorder, unspecified: Secondary | ICD-10-CM

## 2024-09-01 DIAGNOSIS — Z23 Encounter for immunization: Secondary | ICD-10-CM | POA: Diagnosis not present
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
## 2024-09-02 LAB — LIPID PANEL
Cholesterol: 137 mg/dL (ref <200)
HDL: 32 mg/dL — ABNORMAL LOW (ref >=40)
Non-HDL Cholesterol (Calc): 105 mg/dL (ref <130)
Total CHOL/HDL Ratio: 4.3 (calc) (ref <5.0)
Triglycerides: 608 mg/dL — ABNORMAL HIGH (ref <150)

## 2024-09-02 LAB — COMPREHENSIVE METABOLIC PANEL WITH GFR
AG Ratio: 1.7 (calc) (ref 1.0–2.5)
ALT: 67 U/L — ABNORMAL HIGH (ref 9–46)
AST: 52 U/L — ABNORMAL HIGH (ref 10–35)
Albumin: 4.7 g/dL (ref 3.6–5.1)
Alkaline phosphatase (APISO): 76 U/L (ref 35–144)
BUN: 14 mg/dL (ref 7–25)
CO2: 25 mmol/L (ref 20–32)
Calcium: 9.6 mg/dL (ref 8.6–10.3)
Chloride: 103 mmol/L (ref 98–110)
Creat: 0.77 mg/dL (ref 0.70–1.35)
Globulin: 2.7 g/dL (ref 1.9–3.7)
Glucose, Bld: 77 mg/dL (ref 65–99)
Potassium: 4.1 mmol/L (ref 3.5–5.3)
Sodium: 137 mmol/L (ref 135–146)
Total Bilirubin: 0.5 mg/dL (ref 0.2–1.2)
Total Protein: 7.4 g/dL (ref 6.1–8.1)
eGFR: 102 mL/min/1.73m2 (ref >=60)

## 2024-09-02 LAB — CBC
HCT: 46.8 % (ref 38.5–50.0)
Hemoglobin: 16.2 g/dL (ref 13.2–17.1)
MCH: 28.2 pg (ref 27.0–33.0)
MCHC: 34.6 g/dL (ref 32.0–36.0)
MCV: 81.4 fL (ref 80.0–100.0)
MPV: 9.4 fL (ref 7.5–12.5)
Platelets: 221 Thousand/uL (ref 140–400)
RBC: 5.75 Million/uL (ref 4.20–5.80)
RDW: 13.3 % (ref 11.0–15.0)
WBC: 8.8 Thousand/uL (ref 3.8–10.8)

## 2024-09-02 LAB — HEMOGLOBIN A1C
Hgb A1c MFr Bld: 6.2 % — ABNORMAL HIGH (ref <5.7)
Mean Plasma Glucose: 131 mg/dL
eAG (mmol/L): 7.3 mmol/L

## 2024-09-02 LAB — PSA: PSA: 1.41 ng/mL (ref <=4.00)

## 2024-09-02 LAB — TSH: TSH: 1.6 m[IU]/L (ref 0.40–4.50)

## 2024-09-05 ENCOUNTER — Ambulatory Visit: Payer: Self-pay | Admitting: Adult Health

## 2024-09-06 ENCOUNTER — Ambulatory Visit: Admitting: Adult Health

## 2024-09-06 ENCOUNTER — Encounter: Payer: Self-pay | Admitting: Adult Health

## 2024-09-06 VITALS — BP 138/80 | HR 73 | Temp 99.0°F | Ht 70.5 in | Wt 243.0 lb

## 2024-09-06 DIAGNOSIS — L539 Erythematous condition, unspecified: Secondary | ICD-10-CM | POA: Diagnosis not present

## 2024-09-06 DIAGNOSIS — T50Z95A Adverse effect of other vaccines and biological substances, initial encounter: Secondary | ICD-10-CM

## 2024-09-06 MED ORDER — METHYLPREDNISOLONE ACETATE 80 MG/ML IJ SUSP
80.0000 mg | Freq: Once | INTRAMUSCULAR | Status: AC
  Administered 2024-09-06: 80 mg via INTRAMUSCULAR

## 2024-09-06 NOTE — Patient Instructions (Signed)
 There are no preventive care reminders to display for this patient.     09/01/2024    3:51 PM 01/14/2024    1:27 PM 12/08/2022    2:02 PM  Depression screen PHQ 2/9  Decreased Interest 0 1 0  Down, Depressed, Hopeless 0 1 1  PHQ - 2 Score 0 2 1  Altered sleeping 0 3 1  Tired, decreased energy 0 0 1  Change in appetite 0 0 1  Feeling bad or failure about yourself  0 0 0  Trouble concentrating 0 0 0  Moving slowly or fidgety/restless 0 0 0  Suicidal thoughts 0 0 0  PHQ-9 Score 0 5 4  Difficult doing work/chores Not difficult at all Not difficult at all Somewhat difficult
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
## 2024-09-07 ENCOUNTER — Encounter: Admitting: Adult Health

## 2024-09-19 ENCOUNTER — Other Ambulatory Visit: Payer: Self-pay | Admitting: Adult Health

## 2024-09-19 DIAGNOSIS — F411 Generalized anxiety disorder: Secondary | ICD-10-CM

## 2024-09-19 DIAGNOSIS — F3341 Major depressive disorder, recurrent, in partial remission: Secondary | ICD-10-CM

## 2024-11-22 ENCOUNTER — Other Ambulatory Visit: Payer: Self-pay | Admitting: Adult Health

## 2024-11-22 DIAGNOSIS — F419 Anxiety disorder, unspecified: Secondary | ICD-10-CM

## 2024-12-01 ENCOUNTER — Ambulatory Visit: Admitting: Adult Health
